# Patient Record
Sex: Female | Born: 1935 | Race: White | Hispanic: No | Smoking: Never smoker
Health system: Southern US, Community
[De-identification: ages and names within clinical notes are randomized; demographics above are authoritative.]

## PROBLEM LIST (undated history)

## (undated) DIAGNOSIS — F0390 Unspecified dementia without behavioral disturbance: Secondary | ICD-10-CM

## (undated) DIAGNOSIS — C3490 Malignant neoplasm of unspecified part of unspecified bronchus or lung: Secondary | ICD-10-CM

## (undated) DIAGNOSIS — Z853 Personal history of malignant neoplasm of breast: Secondary | ICD-10-CM

## (undated) DIAGNOSIS — S72009A Fracture of unspecified part of neck of unspecified femur, initial encounter for closed fracture: Secondary | ICD-10-CM

## (undated) DIAGNOSIS — G629 Polyneuropathy, unspecified: Secondary | ICD-10-CM

## (undated) DIAGNOSIS — J449 Chronic obstructive pulmonary disease, unspecified: Secondary | ICD-10-CM

## (undated) DIAGNOSIS — K219 Gastro-esophageal reflux disease without esophagitis: Secondary | ICD-10-CM

## (undated) DIAGNOSIS — F411 Generalized anxiety disorder: Secondary | ICD-10-CM

## (undated) DIAGNOSIS — I10 Essential (primary) hypertension: Secondary | ICD-10-CM

## (undated) DIAGNOSIS — C801 Malignant (primary) neoplasm, unspecified: Secondary | ICD-10-CM

## (undated) HISTORY — DX: Generalized anxiety disorder: F41.1

## (undated) HISTORY — PX: LUNG CANCER SURGERY: SHX702

## (undated) HISTORY — DX: Malignant neoplasm of unspecified part of unspecified bronchus or lung: C34.90

## (undated) HISTORY — DX: Gastro-esophageal reflux disease without esophagitis: K21.9

## (undated) HISTORY — DX: Essential (primary) hypertension: I10

## (undated) HISTORY — DX: Malignant (primary) neoplasm, unspecified: C80.1

## (undated) HISTORY — DX: Fracture of unspecified part of neck of unspecified femur, initial encounter for closed fracture: S72.009A

## (undated) HISTORY — PX: BREAST LUMPECTOMY: SHX2

## (undated) HISTORY — DX: Personal history of malignant neoplasm of breast: Z85.3

## (undated) HISTORY — PX: OTHER SURGICAL HISTORY: SHX169

---

## 1998-08-09 ENCOUNTER — Other Ambulatory Visit: Admission: RE | Admit: 1998-08-09 | Discharge: 1998-08-09 | Payer: Self-pay | Admitting: Internal Medicine

## 1999-10-08 ENCOUNTER — Emergency Department (HOSPITAL_COMMUNITY): Admission: EM | Admit: 1999-10-08 | Discharge: 1999-10-08 | Payer: Self-pay | Admitting: Emergency Medicine

## 1999-11-23 ENCOUNTER — Encounter: Payer: Self-pay | Admitting: Surgery

## 1999-11-23 ENCOUNTER — Encounter: Admission: RE | Admit: 1999-11-23 | Discharge: 1999-11-23 | Payer: Self-pay | Admitting: Surgery

## 2000-08-06 ENCOUNTER — Other Ambulatory Visit: Admission: RE | Admit: 2000-08-06 | Discharge: 2000-08-06 | Payer: Self-pay | Admitting: Internal Medicine

## 2003-02-10 ENCOUNTER — Emergency Department (HOSPITAL_COMMUNITY): Admission: EM | Admit: 2003-02-10 | Discharge: 2003-02-10 | Payer: Self-pay | Admitting: Emergency Medicine

## 2003-08-01 ENCOUNTER — Emergency Department (HOSPITAL_COMMUNITY): Admission: EM | Admit: 2003-08-01 | Discharge: 2003-08-01 | Payer: Self-pay | Admitting: Emergency Medicine

## 2003-08-01 ENCOUNTER — Emergency Department (HOSPITAL_COMMUNITY): Admission: EM | Admit: 2003-08-01 | Discharge: 2003-08-02 | Payer: Self-pay | Admitting: Emergency Medicine

## 2003-08-01 ENCOUNTER — Encounter: Payer: Self-pay | Admitting: Emergency Medicine

## 2003-08-03 ENCOUNTER — Emergency Department (HOSPITAL_COMMUNITY): Admission: EM | Admit: 2003-08-03 | Discharge: 2003-08-04 | Payer: Self-pay | Admitting: Emergency Medicine

## 2004-03-11 ENCOUNTER — Emergency Department (HOSPITAL_COMMUNITY): Admission: EM | Admit: 2004-03-11 | Discharge: 2004-03-11 | Payer: Self-pay | Admitting: Emergency Medicine

## 2004-03-12 ENCOUNTER — Emergency Department (HOSPITAL_COMMUNITY): Admission: EM | Admit: 2004-03-12 | Discharge: 2004-03-12 | Payer: Self-pay | Admitting: Emergency Medicine

## 2004-03-13 ENCOUNTER — Emergency Department (HOSPITAL_COMMUNITY): Admission: EM | Admit: 2004-03-13 | Discharge: 2004-03-13 | Payer: Self-pay | Admitting: Emergency Medicine

## 2004-03-14 ENCOUNTER — Emergency Department (HOSPITAL_COMMUNITY): Admission: EM | Admit: 2004-03-14 | Discharge: 2004-03-14 | Payer: Self-pay | Admitting: *Deleted

## 2004-03-15 ENCOUNTER — Emergency Department (HOSPITAL_COMMUNITY): Admission: EM | Admit: 2004-03-15 | Discharge: 2004-03-15 | Payer: Self-pay | Admitting: Family Medicine

## 2004-03-17 ENCOUNTER — Emergency Department (HOSPITAL_COMMUNITY): Admission: EM | Admit: 2004-03-17 | Discharge: 2004-03-17 | Payer: Self-pay | Admitting: Family Medicine

## 2004-03-17 ENCOUNTER — Emergency Department (HOSPITAL_COMMUNITY): Admission: EM | Admit: 2004-03-17 | Discharge: 2004-03-18 | Payer: Self-pay | Admitting: Emergency Medicine

## 2004-03-20 ENCOUNTER — Emergency Department (HOSPITAL_COMMUNITY): Admission: EM | Admit: 2004-03-20 | Discharge: 2004-03-20 | Payer: Self-pay | Admitting: Emergency Medicine

## 2004-03-27 ENCOUNTER — Encounter (INDEPENDENT_AMBULATORY_CARE_PROVIDER_SITE_OTHER): Payer: Self-pay | Admitting: Specialist

## 2004-03-27 ENCOUNTER — Observation Stay (HOSPITAL_COMMUNITY): Admission: RE | Admit: 2004-03-27 | Discharge: 2004-03-28 | Payer: Self-pay | Admitting: Obstetrics and Gynecology

## 2004-04-03 ENCOUNTER — Emergency Department (HOSPITAL_COMMUNITY): Admission: EM | Admit: 2004-04-03 | Discharge: 2004-04-04 | Payer: Self-pay | Admitting: Emergency Medicine

## 2004-04-04 ENCOUNTER — Ambulatory Visit: Admission: RE | Admit: 2004-04-04 | Discharge: 2004-04-04 | Payer: Self-pay | Admitting: Gynecology

## 2004-04-11 ENCOUNTER — Inpatient Hospital Stay (HOSPITAL_COMMUNITY): Admission: RE | Admit: 2004-04-11 | Discharge: 2004-04-14 | Payer: Self-pay | Admitting: Obstetrics and Gynecology

## 2004-04-11 ENCOUNTER — Encounter (INDEPENDENT_AMBULATORY_CARE_PROVIDER_SITE_OTHER): Payer: Self-pay | Admitting: Specialist

## 2004-04-19 ENCOUNTER — Emergency Department (HOSPITAL_COMMUNITY): Admission: EM | Admit: 2004-04-19 | Discharge: 2004-04-19 | Payer: Self-pay | Admitting: Emergency Medicine

## 2004-04-26 ENCOUNTER — Ambulatory Visit: Admission: RE | Admit: 2004-04-26 | Discharge: 2004-04-26 | Payer: Self-pay | Admitting: Gynecology

## 2004-05-01 ENCOUNTER — Ambulatory Visit (HOSPITAL_COMMUNITY): Admission: RE | Admit: 2004-05-01 | Discharge: 2004-05-01 | Payer: Self-pay | Admitting: Gynecology

## 2004-05-10 ENCOUNTER — Ambulatory Visit: Admission: RE | Admit: 2004-05-10 | Discharge: 2004-05-10 | Payer: Self-pay | Admitting: Gynecology

## 2004-05-15 ENCOUNTER — Ambulatory Visit: Admission: RE | Admit: 2004-05-15 | Discharge: 2004-07-27 | Payer: Self-pay | Admitting: Radiation Oncology

## 2004-05-19 ENCOUNTER — Ambulatory Visit (HOSPITAL_COMMUNITY): Admission: RE | Admit: 2004-05-19 | Discharge: 2004-05-19 | Payer: Self-pay | Admitting: Radiation Oncology

## 2004-05-30 ENCOUNTER — Inpatient Hospital Stay (HOSPITAL_COMMUNITY): Admission: AD | Admit: 2004-05-30 | Discharge: 2004-05-30 | Payer: Self-pay | Admitting: Obstetrics and Gynecology

## 2004-05-30 ENCOUNTER — Ambulatory Visit: Admission: RE | Admit: 2004-05-30 | Discharge: 2004-05-30 | Payer: Self-pay | Admitting: Gynecology

## 2004-06-17 ENCOUNTER — Emergency Department (HOSPITAL_COMMUNITY): Admission: EM | Admit: 2004-06-17 | Discharge: 2004-06-17 | Payer: Self-pay | Admitting: Family Medicine

## 2004-06-19 ENCOUNTER — Emergency Department (HOSPITAL_COMMUNITY): Admission: EM | Admit: 2004-06-19 | Discharge: 2004-06-19 | Payer: Self-pay | Admitting: Emergency Medicine

## 2004-08-08 ENCOUNTER — Ambulatory Visit: Admission: RE | Admit: 2004-08-08 | Discharge: 2004-08-08 | Payer: Self-pay | Admitting: Radiation Oncology

## 2004-10-03 ENCOUNTER — Ambulatory Visit: Admission: RE | Admit: 2004-10-03 | Discharge: 2004-10-03 | Payer: Self-pay | Admitting: Gynecology

## 2004-10-03 ENCOUNTER — Other Ambulatory Visit: Admission: RE | Admit: 2004-10-03 | Discharge: 2004-10-03 | Payer: Self-pay | Admitting: Gynecology

## 2005-01-19 ENCOUNTER — Encounter (INDEPENDENT_AMBULATORY_CARE_PROVIDER_SITE_OTHER): Payer: Self-pay | Admitting: *Deleted

## 2005-01-19 ENCOUNTER — Ambulatory Visit: Admission: RE | Admit: 2005-01-19 | Discharge: 2005-01-19 | Payer: Self-pay | Admitting: Gynecology

## 2005-05-11 ENCOUNTER — Encounter (INDEPENDENT_AMBULATORY_CARE_PROVIDER_SITE_OTHER): Payer: Self-pay | Admitting: *Deleted

## 2005-05-11 ENCOUNTER — Ambulatory Visit: Admission: RE | Admit: 2005-05-11 | Discharge: 2005-05-11 | Payer: Self-pay | Admitting: Gynecology

## 2005-05-11 ENCOUNTER — Other Ambulatory Visit: Admission: RE | Admit: 2005-05-11 | Discharge: 2005-05-11 | Payer: Self-pay | Admitting: Gynecology

## 2005-08-24 ENCOUNTER — Ambulatory Visit: Admission: RE | Admit: 2005-08-24 | Discharge: 2005-08-24 | Payer: Self-pay | Admitting: Gynecology

## 2005-08-24 ENCOUNTER — Other Ambulatory Visit: Admission: RE | Admit: 2005-08-24 | Discharge: 2005-08-24 | Payer: Self-pay | Admitting: Gynecology

## 2006-01-11 ENCOUNTER — Ambulatory Visit: Admission: RE | Admit: 2006-01-11 | Discharge: 2006-01-11 | Payer: Self-pay | Admitting: Gynecology

## 2006-01-11 ENCOUNTER — Encounter (INDEPENDENT_AMBULATORY_CARE_PROVIDER_SITE_OTHER): Payer: Self-pay | Admitting: *Deleted

## 2006-01-11 ENCOUNTER — Other Ambulatory Visit: Admission: RE | Admit: 2006-01-11 | Discharge: 2006-01-11 | Payer: Self-pay | Admitting: Gynecology

## 2006-04-30 ENCOUNTER — Other Ambulatory Visit: Admission: RE | Admit: 2006-04-30 | Discharge: 2006-04-30 | Payer: Self-pay | Admitting: Gynecology

## 2006-04-30 ENCOUNTER — Encounter (INDEPENDENT_AMBULATORY_CARE_PROVIDER_SITE_OTHER): Payer: Self-pay | Admitting: *Deleted

## 2006-04-30 ENCOUNTER — Ambulatory Visit: Admission: RE | Admit: 2006-04-30 | Discharge: 2006-04-30 | Payer: Self-pay | Admitting: Gynecology

## 2006-06-26 ENCOUNTER — Ambulatory Visit (HOSPITAL_COMMUNITY): Admission: RE | Admit: 2006-06-26 | Discharge: 2006-06-26 | Payer: Self-pay | Admitting: Gynecology

## 2006-10-30 ENCOUNTER — Other Ambulatory Visit: Admission: RE | Admit: 2006-10-30 | Discharge: 2006-10-30 | Payer: Self-pay | Admitting: Gynecology

## 2006-10-30 ENCOUNTER — Ambulatory Visit: Admission: RE | Admit: 2006-10-30 | Discharge: 2006-10-30 | Payer: Self-pay | Admitting: Gynecology

## 2006-10-30 ENCOUNTER — Encounter (INDEPENDENT_AMBULATORY_CARE_PROVIDER_SITE_OTHER): Payer: Self-pay | Admitting: *Deleted

## 2006-12-01 ENCOUNTER — Emergency Department (HOSPITAL_COMMUNITY): Admission: EM | Admit: 2006-12-01 | Discharge: 2006-12-01 | Payer: Self-pay | Admitting: Emergency Medicine

## 2007-01-19 ENCOUNTER — Emergency Department (HOSPITAL_COMMUNITY): Admission: EM | Admit: 2007-01-19 | Discharge: 2007-01-19 | Payer: Self-pay | Admitting: Emergency Medicine

## 2007-01-27 ENCOUNTER — Emergency Department (HOSPITAL_COMMUNITY): Admission: EM | Admit: 2007-01-27 | Discharge: 2007-01-27 | Payer: Self-pay | Admitting: Emergency Medicine

## 2007-04-22 ENCOUNTER — Ambulatory Visit: Admission: RE | Admit: 2007-04-22 | Discharge: 2007-04-22 | Payer: Self-pay | Admitting: Gynecology

## 2007-04-22 ENCOUNTER — Encounter: Payer: Self-pay | Admitting: Gynecology

## 2007-04-22 ENCOUNTER — Other Ambulatory Visit: Admission: RE | Admit: 2007-04-22 | Discharge: 2007-04-22 | Payer: Self-pay | Admitting: Gynecology

## 2007-08-02 ENCOUNTER — Emergency Department (HOSPITAL_COMMUNITY): Admission: EM | Admit: 2007-08-02 | Discharge: 2007-08-02 | Payer: Self-pay | Admitting: Emergency Medicine

## 2007-08-07 ENCOUNTER — Ambulatory Visit: Payer: Self-pay | Admitting: Thoracic Surgery

## 2007-08-13 ENCOUNTER — Ambulatory Visit (HOSPITAL_COMMUNITY): Admission: RE | Admit: 2007-08-13 | Discharge: 2007-08-13 | Payer: Self-pay | Admitting: Thoracic Surgery

## 2007-08-15 ENCOUNTER — Ambulatory Visit (HOSPITAL_COMMUNITY): Admission: RE | Admit: 2007-08-15 | Discharge: 2007-08-15 | Payer: Self-pay | Admitting: Thoracic Surgery

## 2007-08-15 ENCOUNTER — Ambulatory Visit: Payer: Self-pay | Admitting: Thoracic Surgery

## 2007-08-15 ENCOUNTER — Ambulatory Visit: Payer: Self-pay | Admitting: Internal Medicine

## 2007-08-15 ENCOUNTER — Encounter: Payer: Self-pay | Admitting: Thoracic Surgery

## 2007-08-17 ENCOUNTER — Emergency Department (HOSPITAL_COMMUNITY): Admission: EM | Admit: 2007-08-17 | Discharge: 2007-08-17 | Payer: Self-pay | Admitting: Emergency Medicine

## 2007-08-20 ENCOUNTER — Ambulatory Visit: Admission: RE | Admit: 2007-08-20 | Discharge: 2007-10-06 | Payer: Self-pay | Admitting: Radiation Oncology

## 2007-08-20 LAB — COMPREHENSIVE METABOLIC PANEL
ALT: 8 U/L (ref 0–35)
AST: 10 U/L (ref 0–37)
Albumin: 3.5 g/dL (ref 3.5–5.2)
BUN: 13 mg/dL (ref 6–23)
CO2: 24 mEq/L (ref 19–32)
Calcium: 9.1 mg/dL (ref 8.4–10.5)
Chloride: 106 mEq/L (ref 96–112)
Creatinine, Ser: 0.65 mg/dL (ref 0.40–1.20)
Potassium: 5 mEq/L (ref 3.5–5.3)

## 2007-08-20 LAB — CBC WITH DIFFERENTIAL/PLATELET
Basophils Absolute: 0 10*3/uL (ref 0.0–0.1)
HCT: 36.4 % (ref 34.8–46.6)
HGB: 12 g/dL (ref 11.6–15.9)
MONO#: 0.7 10*3/uL (ref 0.1–0.9)
NEUT#: 4.7 10*3/uL (ref 1.5–6.5)
NEUT%: 68.8 % (ref 39.6–76.8)
RDW: 18 % — ABNORMAL HIGH (ref 11.3–14.5)
WBC: 6.8 10*3/uL (ref 3.9–10.0)
lymph#: 1.2 10*3/uL (ref 0.9–3.3)

## 2007-08-28 ENCOUNTER — Ambulatory Visit: Payer: Self-pay | Admitting: Thoracic Surgery

## 2007-09-01 LAB — CBC WITH DIFFERENTIAL/PLATELET
BASO%: 0.1 % (ref 0.0–2.0)
EOS%: 0.4 % (ref 0.0–7.0)
HCT: 35.6 % (ref 34.8–46.6)
HGB: 11.9 g/dL (ref 11.6–15.9)
MCH: 25.6 pg — ABNORMAL LOW (ref 26.0–34.0)
MCHC: 33.3 g/dL (ref 32.0–36.0)
MONO#: 0.7 10*3/uL (ref 0.1–0.9)
NEUT%: 77.6 % — ABNORMAL HIGH (ref 39.6–76.8)
RDW: 17.6 % — ABNORMAL HIGH (ref 11.3–14.5)
WBC: 6.7 10*3/uL (ref 3.9–10.0)
lymph#: 0.8 10*3/uL — ABNORMAL LOW (ref 0.9–3.3)

## 2007-09-01 LAB — COMPREHENSIVE METABOLIC PANEL
ALT: 8 U/L (ref 0–35)
AST: 11 U/L (ref 0–37)
CO2: 20 mEq/L (ref 19–32)
Chloride: 104 mEq/L (ref 96–112)
Creatinine, Ser: 0.65 mg/dL (ref 0.40–1.20)
Sodium: 139 mEq/L (ref 135–145)
Total Bilirubin: 0.4 mg/dL (ref 0.3–1.2)
Total Protein: 6.3 g/dL (ref 6.0–8.3)

## 2007-09-08 LAB — COMPREHENSIVE METABOLIC PANEL
Albumin: 3.8 g/dL (ref 3.5–5.2)
BUN: 17 mg/dL (ref 6–23)
CO2: 25 mEq/L (ref 19–32)
Calcium: 9.1 mg/dL (ref 8.4–10.5)
Chloride: 104 mEq/L (ref 96–112)
Glucose, Bld: 104 mg/dL — ABNORMAL HIGH (ref 70–99)
Potassium: 4.4 mEq/L (ref 3.5–5.3)
Total Protein: 6.2 g/dL (ref 6.0–8.3)

## 2007-09-08 LAB — CBC WITH DIFFERENTIAL/PLATELET
Basophils Absolute: 0 10*3/uL (ref 0.0–0.1)
Eosinophils Absolute: 0.2 10*3/uL (ref 0.0–0.5)
HCT: 33.8 % — ABNORMAL LOW (ref 34.8–46.6)
MCV: 76.9 fL — ABNORMAL LOW (ref 81.0–101.0)
MONO#: 0.7 10*3/uL (ref 0.1–0.9)
RDW: 18.3 % — ABNORMAL HIGH (ref 11.3–14.5)
lymph#: 0.9 10*3/uL (ref 0.9–3.3)

## 2007-09-15 LAB — COMPREHENSIVE METABOLIC PANEL
AST: 13 U/L (ref 0–37)
Albumin: 3.9 g/dL (ref 3.5–5.2)
Alkaline Phosphatase: 145 U/L — ABNORMAL HIGH (ref 39–117)
BUN: 17 mg/dL (ref 6–23)
Calcium: 9 mg/dL (ref 8.4–10.5)
Chloride: 105 mEq/L (ref 96–112)
Glucose, Bld: 93 mg/dL (ref 70–99)
Potassium: 4.8 mEq/L (ref 3.5–5.3)
Sodium: 140 mEq/L (ref 135–145)
Total Protein: 6.5 g/dL (ref 6.0–8.3)

## 2007-09-15 LAB — CBC WITH DIFFERENTIAL/PLATELET
Basophils Absolute: 0 10*3/uL (ref 0.0–0.1)
EOS%: 0.2 % (ref 0.0–7.0)
Eosinophils Absolute: 0 10*3/uL (ref 0.0–0.5)
HGB: 11.6 g/dL (ref 11.6–15.9)
NEUT#: 7.7 10*3/uL — ABNORMAL HIGH (ref 1.5–6.5)
RBC: 4.43 10*6/uL (ref 3.70–5.32)
RDW: 18.4 % — ABNORMAL HIGH (ref 11.3–14.5)
lymph#: 1.1 10*3/uL (ref 0.9–3.3)

## 2007-09-18 ENCOUNTER — Ambulatory Visit: Payer: Self-pay | Admitting: Thoracic Surgery

## 2007-09-22 LAB — COMPREHENSIVE METABOLIC PANEL
ALT: 13 U/L (ref 0–35)
AST: 16 U/L (ref 0–37)
Albumin: 4 g/dL (ref 3.5–5.2)
CO2: 21 mEq/L (ref 19–32)
Calcium: 9.3 mg/dL (ref 8.4–10.5)
Chloride: 102 mEq/L (ref 96–112)
Potassium: 4.7 mEq/L (ref 3.5–5.3)
Sodium: 138 mEq/L (ref 135–145)
Total Protein: 6.7 g/dL (ref 6.0–8.3)

## 2007-09-22 LAB — CBC WITH DIFFERENTIAL/PLATELET
BASO%: 0.9 % (ref 0.0–2.0)
EOS%: 0.5 % (ref 0.0–7.0)
HCT: 34 % — ABNORMAL LOW (ref 34.8–46.6)
MCH: 26.7 pg (ref 26.0–34.0)
MCHC: 33.7 g/dL (ref 32.0–36.0)
MONO#: 0.7 10*3/uL (ref 0.1–0.9)
RDW: 18.2 % — ABNORMAL HIGH (ref 11.3–14.5)
WBC: 5.9 10*3/uL (ref 3.9–10.0)
lymph#: 0.8 10*3/uL — ABNORMAL LOW (ref 0.9–3.3)

## 2007-09-29 LAB — CBC WITH DIFFERENTIAL/PLATELET
BASO%: 0.3 % (ref 0.0–2.0)
EOS%: 0.5 % (ref 0.0–7.0)
HCT: 32.3 % — ABNORMAL LOW (ref 34.8–46.6)
MCH: 26.9 pg (ref 26.0–34.0)
MCHC: 33.2 g/dL (ref 32.0–36.0)
MONO#: 0.8 10*3/uL (ref 0.1–0.9)
NEUT%: 72.7 % (ref 39.6–76.8)
RBC: 3.98 10*6/uL (ref 3.70–5.32)
RDW: 24.5 % — ABNORMAL HIGH (ref 11.3–14.5)
WBC: 6.5 10*3/uL (ref 3.9–10.0)
lymph#: 0.9 10*3/uL (ref 0.9–3.3)

## 2007-09-29 LAB — COMPREHENSIVE METABOLIC PANEL
ALT: 10 U/L (ref 0–35)
AST: 12 U/L (ref 0–37)
Calcium: 9 mg/dL (ref 8.4–10.5)
Chloride: 104 mEq/L (ref 96–112)
Creatinine, Ser: 0.68 mg/dL (ref 0.40–1.20)
Sodium: 137 mEq/L (ref 135–145)
Total Bilirubin: 0.5 mg/dL (ref 0.3–1.2)
Total Protein: 6.2 g/dL (ref 6.0–8.3)

## 2007-10-02 ENCOUNTER — Ambulatory Visit: Payer: Self-pay | Admitting: Internal Medicine

## 2007-10-06 ENCOUNTER — Other Ambulatory Visit: Payer: Self-pay | Admitting: Internal Medicine

## 2007-10-06 LAB — CBC WITH DIFFERENTIAL/PLATELET
BASO%: 0.2 % (ref 0.0–2.0)
EOS%: 0.1 % (ref 0.0–7.0)
HCT: 32.7 % — ABNORMAL LOW (ref 34.8–46.6)
LYMPH%: 13.2 % — ABNORMAL LOW (ref 14.0–48.0)
MCH: 27.5 pg (ref 26.0–34.0)
MCHC: 33.6 g/dL (ref 32.0–36.0)
NEUT%: 77.8 % — ABNORMAL HIGH (ref 39.6–76.8)
Platelets: 158 10*3/uL (ref 145–400)
RBC: 4 10*6/uL (ref 3.70–5.32)

## 2007-10-06 LAB — COMPREHENSIVE METABOLIC PANEL
ALT: 13 U/L (ref 0–35)
AST: 13 U/L (ref 0–37)
Alkaline Phosphatase: 147 U/L — ABNORMAL HIGH (ref 39–117)
Creatinine, Ser: 0.72 mg/dL (ref 0.40–1.20)
Total Bilirubin: 0.3 mg/dL (ref 0.3–1.2)

## 2007-10-13 LAB — CBC WITH DIFFERENTIAL/PLATELET
Basophils Absolute: 0 10*3/uL (ref 0.0–0.1)
Eosinophils Absolute: 0 10*3/uL (ref 0.0–0.5)
HGB: 10.7 g/dL — ABNORMAL LOW (ref 11.6–15.9)
LYMPH%: 19.1 % (ref 14.0–48.0)
MCV: 83.3 fL (ref 81.0–101.0)
MONO%: 8.7 % (ref 0.0–13.0)
NEUT#: 4 10*3/uL (ref 1.5–6.5)
Platelets: 179 10*3/uL (ref 145–400)

## 2007-10-13 LAB — COMPREHENSIVE METABOLIC PANEL
Albumin: 3.9 g/dL (ref 3.5–5.2)
Alkaline Phosphatase: 112 U/L (ref 39–117)
BUN: 14 mg/dL (ref 6–23)
Glucose, Bld: 75 mg/dL (ref 70–99)
Potassium: 3.7 mEq/L (ref 3.5–5.3)

## 2007-11-03 LAB — CBC WITH DIFFERENTIAL/PLATELET
Basophils Absolute: 0 10*3/uL (ref 0.0–0.1)
Eosinophils Absolute: 0 10*3/uL (ref 0.0–0.5)
HGB: 10.6 g/dL — ABNORMAL LOW (ref 11.6–15.9)
MCV: 89 fL (ref 81.0–101.0)
MONO#: 0.6 10*3/uL (ref 0.1–0.9)
MONO%: 14.5 % — ABNORMAL HIGH (ref 0.0–13.0)
NEUT#: 2.3 10*3/uL (ref 1.5–6.5)
RDW: 30.9 % — ABNORMAL HIGH (ref 11.3–14.5)
WBC: 3.9 10*3/uL (ref 3.9–10.0)
lymph#: 1 10*3/uL (ref 0.9–3.3)

## 2007-11-03 LAB — COMPREHENSIVE METABOLIC PANEL
Albumin: 3.8 g/dL (ref 3.5–5.2)
BUN: 12 mg/dL (ref 6–23)
CO2: 23 mEq/L (ref 19–32)
Calcium: 9.2 mg/dL (ref 8.4–10.5)
Chloride: 107 mEq/L (ref 96–112)
Glucose, Bld: 87 mg/dL (ref 70–99)
Potassium: 4.9 mEq/L (ref 3.5–5.3)
Total Protein: 5.9 g/dL — ABNORMAL LOW (ref 6.0–8.3)

## 2007-11-10 LAB — CBC WITH DIFFERENTIAL/PLATELET
Basophils Absolute: 0 10*3/uL (ref 0.0–0.1)
Eosinophils Absolute: 0.1 10*3/uL (ref 0.0–0.5)
HGB: 11.3 g/dL — ABNORMAL LOW (ref 11.6–15.9)
NEUT#: 2.6 10*3/uL (ref 1.5–6.5)
RBC: 3.67 10*6/uL — ABNORMAL LOW (ref 3.70–5.32)
RDW: 21.1 % — ABNORMAL HIGH (ref 11.3–14.5)
WBC: 4.3 10*3/uL (ref 3.9–10.0)
lymph#: 1 10*3/uL (ref 0.9–3.3)

## 2007-11-10 LAB — COMPREHENSIVE METABOLIC PANEL
Alkaline Phosphatase: 118 U/L — ABNORMAL HIGH (ref 39–117)
BUN: 11 mg/dL (ref 6–23)
Creatinine, Ser: 0.65 mg/dL (ref 0.40–1.20)
Glucose, Bld: 89 mg/dL (ref 70–99)
Total Bilirubin: 0.5 mg/dL (ref 0.3–1.2)

## 2007-11-13 ENCOUNTER — Ambulatory Visit: Payer: Self-pay | Admitting: Internal Medicine

## 2007-11-17 ENCOUNTER — Inpatient Hospital Stay (HOSPITAL_COMMUNITY): Admission: AD | Admit: 2007-11-17 | Discharge: 2007-11-20 | Payer: Self-pay | Admitting: Internal Medicine

## 2007-11-17 ENCOUNTER — Ambulatory Visit: Payer: Self-pay | Admitting: Internal Medicine

## 2007-11-17 LAB — CBC WITH DIFFERENTIAL/PLATELET
BASO%: 0 % (ref 0.0–2.0)
MCHC: 33.5 g/dL (ref 32.0–36.0)
MONO#: 0.2 10*3/uL (ref 0.1–0.9)
RBC: 3.41 10*6/uL — ABNORMAL LOW (ref 3.70–5.32)
WBC: 0.7 10*3/uL — CL (ref 3.9–10.0)
lymph#: 0.4 10*3/uL — ABNORMAL LOW (ref 0.9–3.3)

## 2007-11-17 LAB — COMPREHENSIVE METABOLIC PANEL
ALT: 26 U/L (ref 0–35)
CO2: 24 mEq/L (ref 19–32)
Calcium: 8.8 mg/dL (ref 8.4–10.5)
Chloride: 103 mEq/L (ref 96–112)
Potassium: 4.5 mEq/L (ref 3.5–5.3)
Sodium: 138 mEq/L (ref 135–145)
Total Protein: 6 g/dL (ref 6.0–8.3)

## 2007-11-25 ENCOUNTER — Encounter: Payer: Self-pay | Admitting: Gynecology

## 2007-11-25 ENCOUNTER — Other Ambulatory Visit: Admission: RE | Admit: 2007-11-25 | Discharge: 2007-11-25 | Payer: Self-pay | Admitting: Gynecology

## 2007-11-25 ENCOUNTER — Ambulatory Visit: Admission: RE | Admit: 2007-11-25 | Discharge: 2007-11-25 | Payer: Self-pay | Admitting: Gynecology

## 2007-11-25 LAB — COMPREHENSIVE METABOLIC PANEL
ALT: 24 U/L (ref 0–35)
AST: 16 U/L (ref 0–37)
Alkaline Phosphatase: 132 U/L — ABNORMAL HIGH (ref 39–117)
CO2: 23 mEq/L (ref 19–32)
Sodium: 140 mEq/L (ref 135–145)
Total Bilirubin: 0.4 mg/dL (ref 0.3–1.2)
Total Protein: 6.2 g/dL (ref 6.0–8.3)

## 2007-11-25 LAB — CBC WITH DIFFERENTIAL/PLATELET
BASO%: 0.1 % (ref 0.0–2.0)
LYMPH%: 11.9 % — ABNORMAL LOW (ref 14.0–48.0)
MCHC: 34.8 g/dL (ref 32.0–36.0)
MONO#: 0.5 10*3/uL (ref 0.1–0.9)
Platelets: 90 10*3/uL — ABNORMAL LOW (ref 145–400)
RBC: 4.03 10*6/uL (ref 3.70–5.32)
WBC: 6.7 10*3/uL (ref 3.9–10.0)
lymph#: 0.8 10*3/uL — ABNORMAL LOW (ref 0.9–3.3)

## 2007-12-01 LAB — CBC WITH DIFFERENTIAL/PLATELET
BASO%: 1.4 % (ref 0.0–2.0)
Basophils Absolute: 0.1 10*3/uL (ref 0.0–0.1)
HCT: 36.8 % (ref 34.8–46.6)
HGB: 12.7 g/dL (ref 11.6–15.9)
MCHC: 34.5 g/dL (ref 32.0–36.0)
MONO#: 0.5 10*3/uL (ref 0.1–0.9)
NEUT%: 56.8 % (ref 39.6–76.8)
RDW: 17.8 % — ABNORMAL HIGH (ref 11.3–14.5)
WBC: 3.6 10*3/uL — ABNORMAL LOW (ref 3.9–10.0)
lymph#: 0.9 10*3/uL (ref 0.9–3.3)

## 2007-12-01 LAB — COMPREHENSIVE METABOLIC PANEL
ALT: 18 U/L (ref 0–35)
Albumin: 4.1 g/dL (ref 3.5–5.2)
CO2: 22 mEq/L (ref 19–32)
Calcium: 9.4 mg/dL (ref 8.4–10.5)
Chloride: 107 mEq/L (ref 96–112)
Creatinine, Ser: 0.64 mg/dL (ref 0.40–1.20)
Potassium: 5.3 mEq/L (ref 3.5–5.3)
Total Protein: 6.4 g/dL (ref 6.0–8.3)

## 2007-12-08 LAB — CBC WITH DIFFERENTIAL/PLATELET
BASO%: 0.3 % (ref 0.0–2.0)
EOS%: 0.9 % (ref 0.0–7.0)
HCT: 33.7 % — ABNORMAL LOW (ref 34.8–46.6)
MCH: 32.9 pg (ref 26.0–34.0)
MCHC: 34.8 g/dL (ref 32.0–36.0)
MONO#: 0.7 10*3/uL (ref 0.1–0.9)
RBC: 3.56 10*6/uL — ABNORMAL LOW (ref 3.70–5.32)
RDW: 20 % — ABNORMAL HIGH (ref 11.3–14.5)
WBC: 8.4 10*3/uL (ref 3.9–10.0)
lymph#: 1.1 10*3/uL (ref 0.9–3.3)

## 2007-12-16 LAB — CBC WITH DIFFERENTIAL/PLATELET
Eosinophils Absolute: 0 10*3/uL (ref 0.0–0.5)
MONO#: 0.6 10*3/uL (ref 0.1–0.9)
NEUT#: 4.2 10*3/uL (ref 1.5–6.5)
Platelets: 127 10*3/uL — ABNORMAL LOW (ref 145–400)
RBC: 3.37 10*6/uL — ABNORMAL LOW (ref 3.70–5.32)
RDW: 21 % — ABNORMAL HIGH (ref 11.3–14.5)
WBC: 5.5 10*3/uL (ref 3.9–10.0)
lymph#: 0.7 10*3/uL — ABNORMAL LOW (ref 0.9–3.3)

## 2007-12-16 LAB — COMPREHENSIVE METABOLIC PANEL
ALT: 13 U/L (ref 0–35)
Albumin: 3.9 g/dL (ref 3.5–5.2)
CO2: 24 mEq/L (ref 19–32)
Chloride: 105 mEq/L (ref 96–112)
Glucose, Bld: 105 mg/dL — ABNORMAL HIGH (ref 70–99)
Potassium: 4.6 mEq/L (ref 3.5–5.3)
Sodium: 139 mEq/L (ref 135–145)
Total Protein: 5.8 g/dL — ABNORMAL LOW (ref 6.0–8.3)

## 2007-12-23 LAB — COMPREHENSIVE METABOLIC PANEL
AST: 20 U/L (ref 0–37)
Albumin: 4 g/dL (ref 3.5–5.2)
Alkaline Phosphatase: 112 U/L (ref 39–117)
Potassium: 4.4 mEq/L (ref 3.5–5.3)
Sodium: 140 mEq/L (ref 135–145)
Total Bilirubin: 0.5 mg/dL (ref 0.3–1.2)
Total Protein: 6.3 g/dL (ref 6.0–8.3)

## 2007-12-23 LAB — CBC WITH DIFFERENTIAL/PLATELET
Basophils Absolute: 0.2 10*3/uL — ABNORMAL HIGH (ref 0.0–0.1)
EOS%: 0.9 % (ref 0.0–7.0)
Eosinophils Absolute: 0 10*3/uL (ref 0.0–0.5)
HGB: 11.7 g/dL (ref 11.6–15.9)
LYMPH%: 14.8 % (ref 14.0–48.0)
MCH: 34.3 pg — ABNORMAL HIGH (ref 26.0–34.0)
MCV: 101 fL (ref 81.0–101.0)
MONO%: 16.3 % — ABNORMAL HIGH (ref 0.0–13.0)
Platelets: 139 10*3/uL — ABNORMAL LOW (ref 145–400)
RBC: 3.41 10*6/uL — ABNORMAL LOW (ref 3.70–5.32)
RDW: 18.6 % — ABNORMAL HIGH (ref 11.3–14.5)

## 2007-12-29 ENCOUNTER — Ambulatory Visit: Payer: Self-pay | Admitting: Internal Medicine

## 2007-12-29 LAB — COMPREHENSIVE METABOLIC PANEL
ALT: 14 U/L (ref 0–35)
AST: 15 U/L (ref 0–37)
Albumin: 4.1 g/dL (ref 3.5–5.2)
Alkaline Phosphatase: 120 U/L — ABNORMAL HIGH (ref 39–117)
BUN: 17 mg/dL (ref 6–23)
CO2: 23 mEq/L (ref 19–32)
Calcium: 8.9 mg/dL (ref 8.4–10.5)
Chloride: 106 mEq/L (ref 96–112)
Creatinine, Ser: 0.62 mg/dL (ref 0.40–1.20)
Glucose, Bld: 107 mg/dL — ABNORMAL HIGH (ref 70–99)
Potassium: 4.3 mEq/L (ref 3.5–5.3)
Sodium: 141 mEq/L (ref 135–145)
Total Bilirubin: 1 mg/dL (ref 0.3–1.2)
Total Protein: 6 g/dL (ref 6.0–8.3)

## 2007-12-29 LAB — CBC WITH DIFFERENTIAL/PLATELET
BASO%: 0.1 % (ref 0.0–2.0)
EOS%: 0.6 % (ref 0.0–7.0)
Eosinophils Absolute: 0 10*3/uL (ref 0.0–0.5)
LYMPH%: 10.7 % — ABNORMAL LOW (ref 14.0–48.0)
MCHC: 34.6 g/dL (ref 32.0–36.0)
MCV: 101.3 fL — ABNORMAL HIGH (ref 81.0–101.0)
MONO%: 7.9 % (ref 0.0–13.0)
NEUT#: 4.6 10*3/uL (ref 1.5–6.5)
Platelets: 65 10*3/uL — ABNORMAL LOW (ref 145–400)
RBC: 3.03 10*6/uL — ABNORMAL LOW (ref 3.70–5.32)
RDW: 20.2 % — ABNORMAL HIGH (ref 11.3–14.5)

## 2008-01-07 ENCOUNTER — Ambulatory Visit (HOSPITAL_COMMUNITY): Admission: RE | Admit: 2008-01-07 | Discharge: 2008-01-07 | Payer: Self-pay | Admitting: Internal Medicine

## 2008-03-16 ENCOUNTER — Ambulatory Visit (HOSPITAL_COMMUNITY): Admission: RE | Admit: 2008-03-16 | Discharge: 2008-03-16 | Payer: Self-pay | Admitting: Internal Medicine

## 2008-04-02 ENCOUNTER — Ambulatory Visit: Payer: Self-pay | Admitting: Internal Medicine

## 2008-04-06 LAB — COMPREHENSIVE METABOLIC PANEL
CO2: 25 mEq/L (ref 19–32)
Calcium: 8.6 mg/dL (ref 8.4–10.5)
Glucose, Bld: 99 mg/dL (ref 70–99)
Sodium: 145 mEq/L (ref 135–145)
Total Bilirubin: 0.5 mg/dL (ref 0.3–1.2)
Total Protein: 5.7 g/dL — ABNORMAL LOW (ref 6.0–8.3)

## 2008-04-06 LAB — CBC WITH DIFFERENTIAL/PLATELET
BASO%: 0.3 % (ref 0.0–2.0)
LYMPH%: 16.1 % (ref 14.0–48.0)
MCH: 34.5 pg — ABNORMAL HIGH (ref 26.0–34.0)
MCHC: 34.3 g/dL (ref 32.0–36.0)
MCV: 100.6 fL (ref 81.0–101.0)
MONO%: 7.2 % (ref 0.0–13.0)
Platelets: 216 10*3/uL (ref 145–400)
RBC: 3.77 10*6/uL (ref 3.70–5.32)

## 2008-04-08 ENCOUNTER — Ambulatory Visit (HOSPITAL_COMMUNITY): Admission: RE | Admit: 2008-04-08 | Discharge: 2008-04-08 | Payer: Self-pay | Admitting: Internal Medicine

## 2008-06-08 ENCOUNTER — Other Ambulatory Visit: Admission: RE | Admit: 2008-06-08 | Discharge: 2008-06-08 | Payer: Self-pay | Admitting: Neurology

## 2008-06-08 ENCOUNTER — Ambulatory Visit: Admission: RE | Admit: 2008-06-08 | Discharge: 2008-06-08 | Payer: Self-pay | Admitting: Gynecology

## 2008-06-08 ENCOUNTER — Encounter: Payer: Self-pay | Admitting: Gynecology

## 2008-07-05 ENCOUNTER — Ambulatory Visit: Payer: Self-pay | Admitting: Internal Medicine

## 2008-07-07 ENCOUNTER — Ambulatory Visit (HOSPITAL_COMMUNITY): Admission: RE | Admit: 2008-07-07 | Discharge: 2008-07-07 | Payer: Self-pay | Admitting: Internal Medicine

## 2008-07-07 LAB — COMPREHENSIVE METABOLIC PANEL
ALT: 17 U/L (ref 0–35)
AST: 19 U/L (ref 0–37)
Alkaline Phosphatase: 77 U/L (ref 39–117)
CO2: 30 mEq/L (ref 19–32)
Sodium: 141 mEq/L (ref 135–145)
Total Bilirubin: 0.7 mg/dL (ref 0.3–1.2)
Total Protein: 5.8 g/dL — ABNORMAL LOW (ref 6.0–8.3)

## 2008-07-07 LAB — CBC WITH DIFFERENTIAL/PLATELET
BASO%: 0.3 % (ref 0.0–2.0)
LYMPH%: 24.1 % (ref 14.0–48.0)
MCHC: 34.1 g/dL (ref 32.0–36.0)
MONO#: 0.5 10*3/uL (ref 0.1–0.9)
MONO%: 12.1 % (ref 0.0–13.0)
Platelets: 204 10*3/uL (ref 145–400)
RBC: 4.02 10*6/uL (ref 3.70–5.32)
WBC: 3.8 10*3/uL — ABNORMAL LOW (ref 3.9–10.0)

## 2008-07-13 ENCOUNTER — Encounter: Payer: Self-pay | Admitting: Gastroenterology

## 2008-08-05 DIAGNOSIS — Z853 Personal history of malignant neoplasm of breast: Secondary | ICD-10-CM

## 2008-08-05 DIAGNOSIS — F411 Generalized anxiety disorder: Secondary | ICD-10-CM | POA: Insufficient documentation

## 2008-08-05 DIAGNOSIS — C549 Malignant neoplasm of corpus uteri, unspecified: Secondary | ICD-10-CM

## 2008-08-05 DIAGNOSIS — D649 Anemia, unspecified: Secondary | ICD-10-CM

## 2008-08-05 DIAGNOSIS — C3490 Malignant neoplasm of unspecified part of unspecified bronchus or lung: Secondary | ICD-10-CM | POA: Insufficient documentation

## 2008-08-05 DIAGNOSIS — I10 Essential (primary) hypertension: Secondary | ICD-10-CM | POA: Insufficient documentation

## 2008-08-05 HISTORY — DX: Generalized anxiety disorder: F41.1

## 2008-08-05 HISTORY — DX: Essential (primary) hypertension: I10

## 2008-08-05 HISTORY — DX: Personal history of malignant neoplasm of breast: Z85.3

## 2008-08-05 HISTORY — DX: Malignant neoplasm of unspecified part of unspecified bronchus or lung: C34.90

## 2008-08-09 ENCOUNTER — Ambulatory Visit: Payer: Self-pay | Admitting: Gastroenterology

## 2008-08-09 DIAGNOSIS — R933 Abnormal findings on diagnostic imaging of other parts of digestive tract: Secondary | ICD-10-CM

## 2008-08-12 ENCOUNTER — Ambulatory Visit: Payer: Self-pay | Admitting: Gastroenterology

## 2008-10-04 ENCOUNTER — Ambulatory Visit: Payer: Self-pay | Admitting: Internal Medicine

## 2008-10-06 ENCOUNTER — Ambulatory Visit (HOSPITAL_COMMUNITY): Admission: RE | Admit: 2008-10-06 | Discharge: 2008-10-06 | Payer: Self-pay | Admitting: Internal Medicine

## 2008-10-06 LAB — CBC WITH DIFFERENTIAL/PLATELET
BASO%: 0.4 % (ref 0.0–2.0)
LYMPH%: 22.6 % (ref 14.0–48.0)
MCHC: 33.6 g/dL (ref 32.0–36.0)
MONO#: 0.4 10*3/uL (ref 0.1–0.9)
RBC: 4.38 10*6/uL (ref 3.70–5.32)
RDW: 13.7 % (ref 11.3–14.5)
WBC: 4.5 10*3/uL (ref 3.9–10.0)
lymph#: 1 10*3/uL (ref 0.9–3.3)

## 2008-10-06 LAB — COMPREHENSIVE METABOLIC PANEL
ALT: 16 U/L (ref 0–35)
CO2: 30 mEq/L (ref 19–32)
Chloride: 106 mEq/L (ref 96–112)
Potassium: 4.7 mEq/L (ref 3.5–5.3)
Sodium: 141 mEq/L (ref 135–145)
Total Bilirubin: 0.9 mg/dL (ref 0.3–1.2)
Total Protein: 6.3 g/dL (ref 6.0–8.3)

## 2008-10-12 ENCOUNTER — Encounter: Payer: Self-pay | Admitting: Gastroenterology

## 2009-01-31 ENCOUNTER — Ambulatory Visit: Payer: Self-pay | Admitting: Internal Medicine

## 2009-02-01 IMAGING — CT CT CHEST W/ CM
2 of 7 series · 16 of 46 positions shown, 18 images · IV contrast (agent unspecified)
Comparison: 01/07/2008

CT CHEST

CLINICAL DATA: Follow-up lung carcinoma.  Breast cancer and
endometrial cancer..  Hemoptysis.  Nausea and vomiting.

CT CHEST, ABDOMEN AND PELVIS WITH CONTRAST
TECHNIQUE: Multidetector CT imaging of the chest, abdomen and
pelvis was performed following the standard protocol during bolus
administration of intravenous contrast.
Contrast: 100 ml 2mnipaque-400 and oral contrast

[Series 2: cap 5.0 b40f · axial · 0.65mm/px · z∈[-572,-22]mm · 13 of 126 slices shown, 15 images]
[im 8/126  soft-tissue]
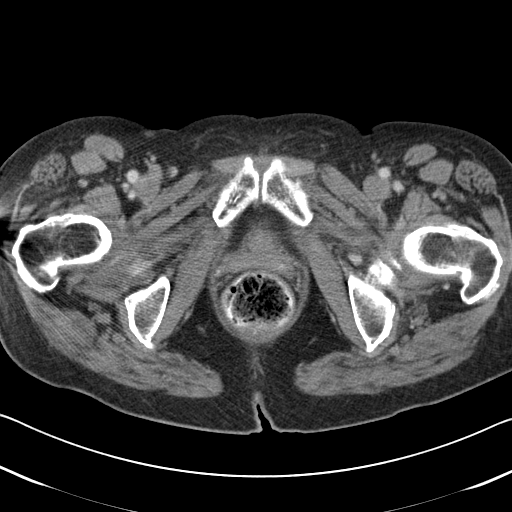
[im 8/126  bone]
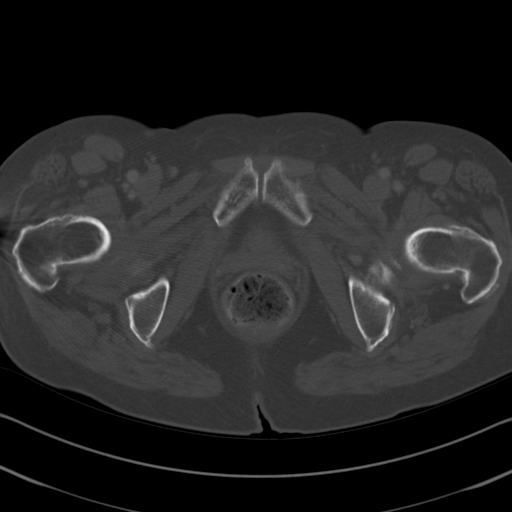
[im 16/126  soft-tissue]
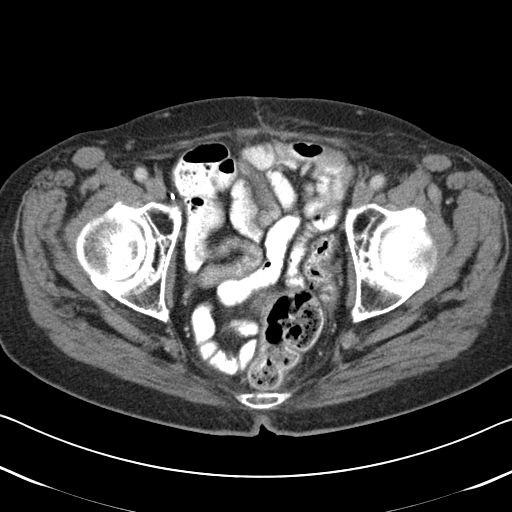
[im 24/126  soft-tissue]
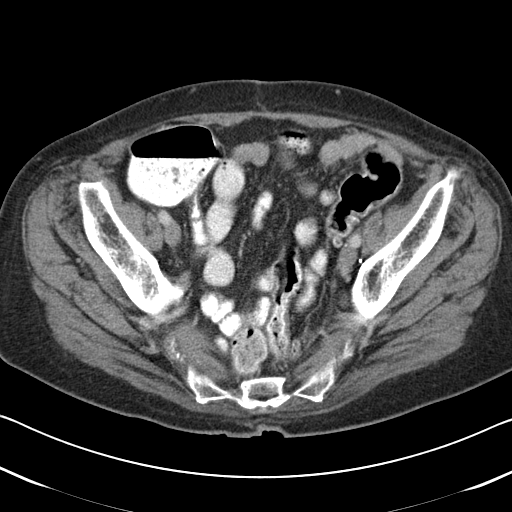
[im 40/126  soft-tissue]
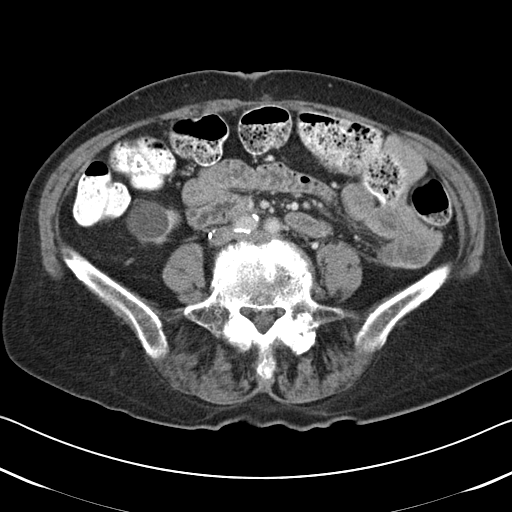
[im 47/126  soft-tissue]
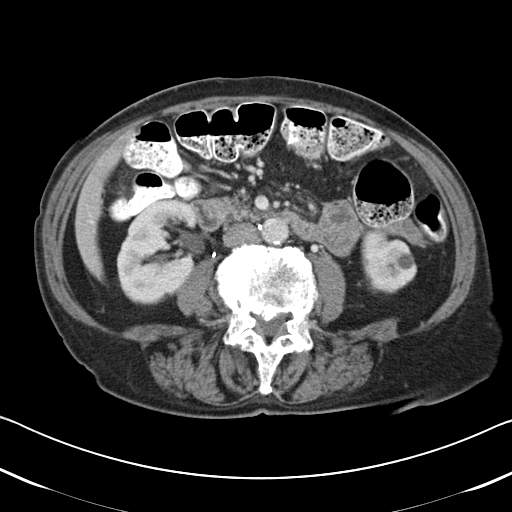
[im 55/126  soft-tissue]
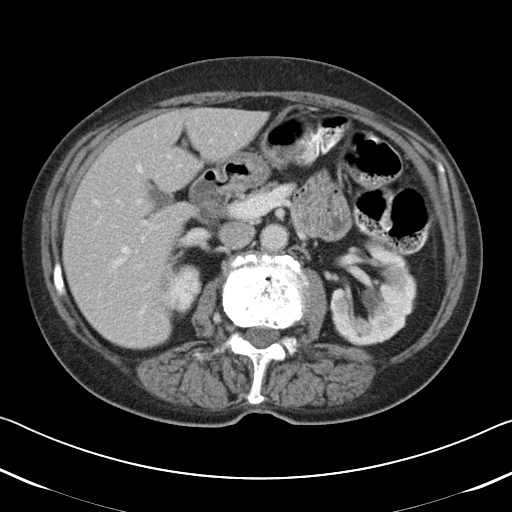
[im 63/126  soft-tissue]
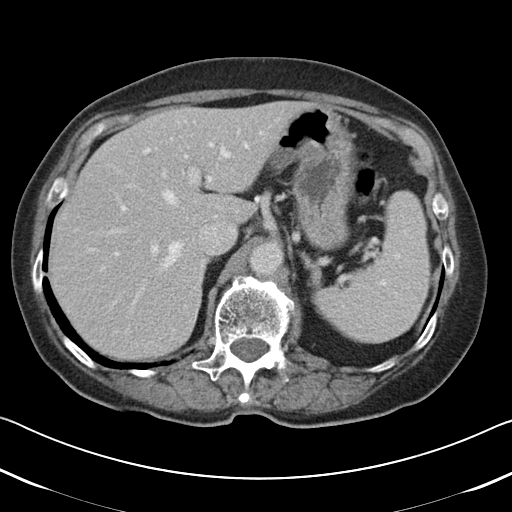
[im 71/126  soft-tissue]
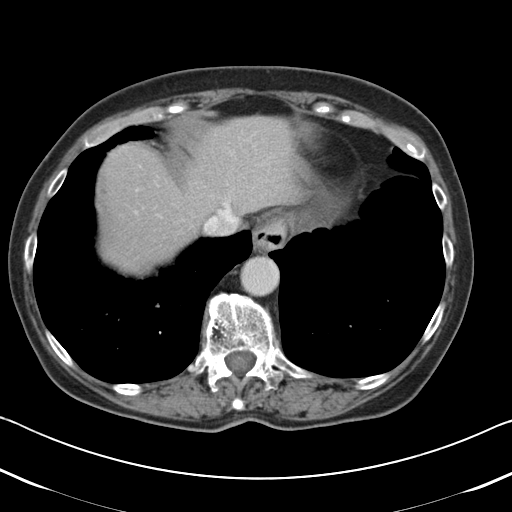
[im 79/126  soft-tissue]
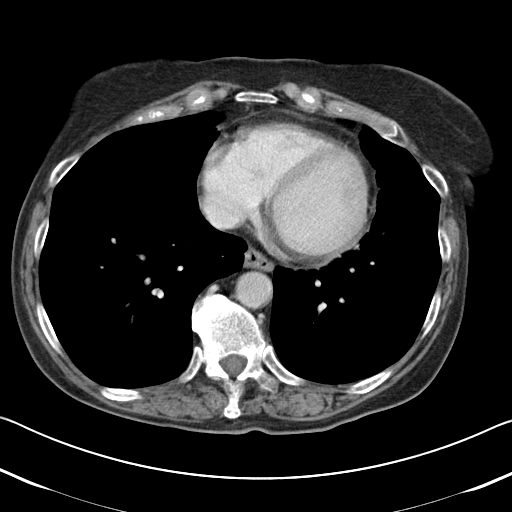
[im 79/126  bone]
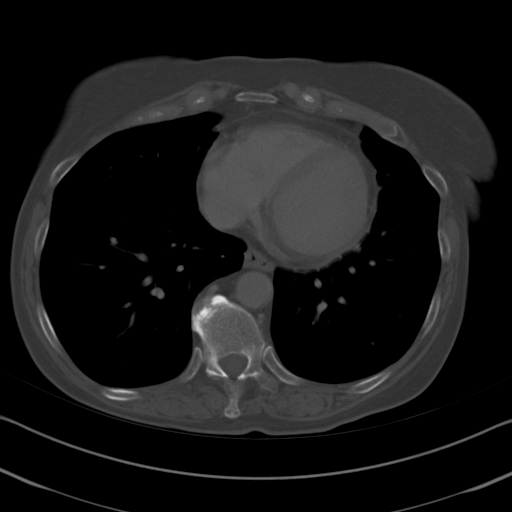
[im 86/126  soft-tissue]
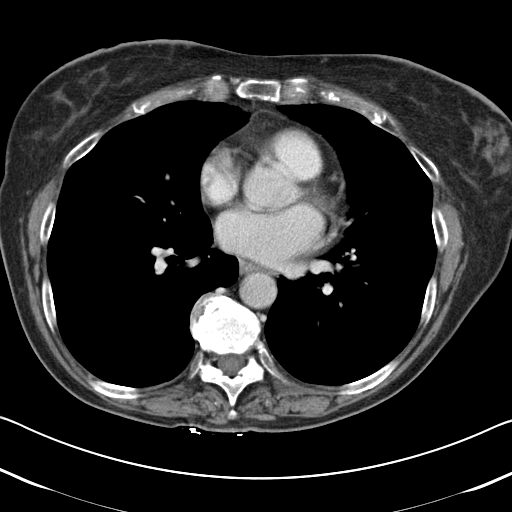
[im 102/126  soft-tissue]
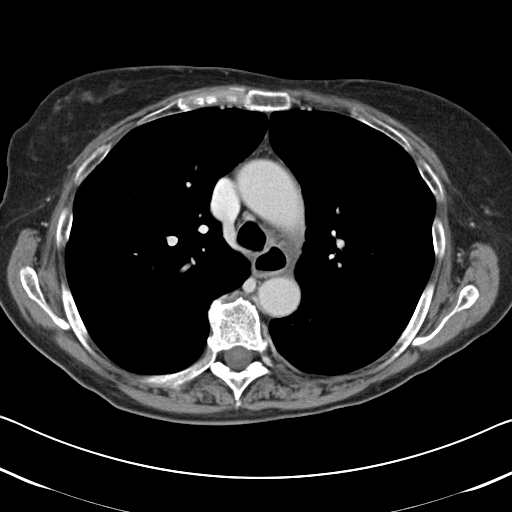
[im 110/126  soft-tissue]
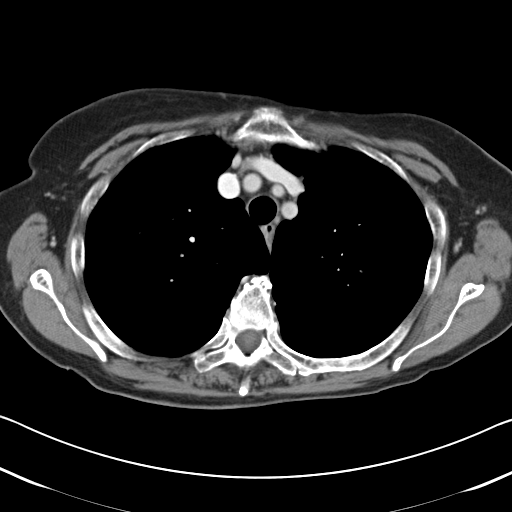
[im 118/126  soft-tissue]
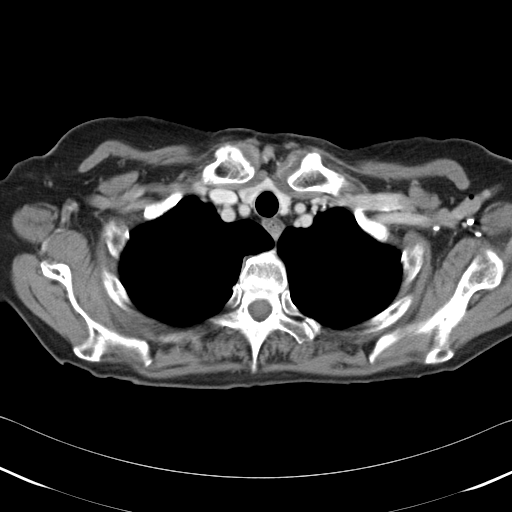

[Series 602: <mpr thick range> · coronal · 1.22mm/px · 3 of 70 slices shown]
[im 18/70  soft-tissue]
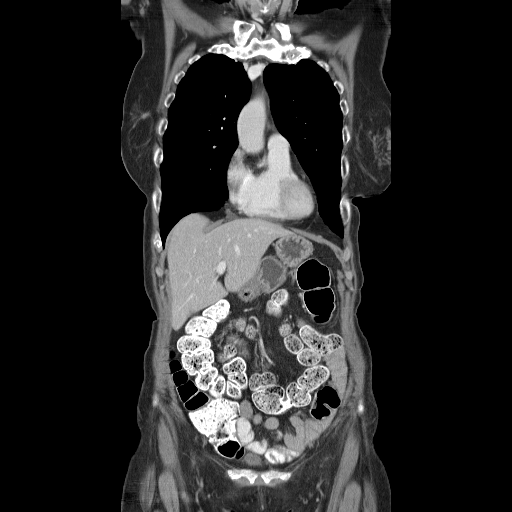
[im 35/70  soft-tissue]
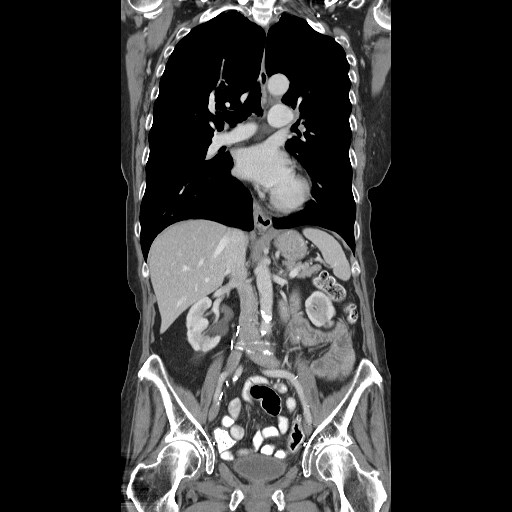
[im 52/70  soft-tissue]
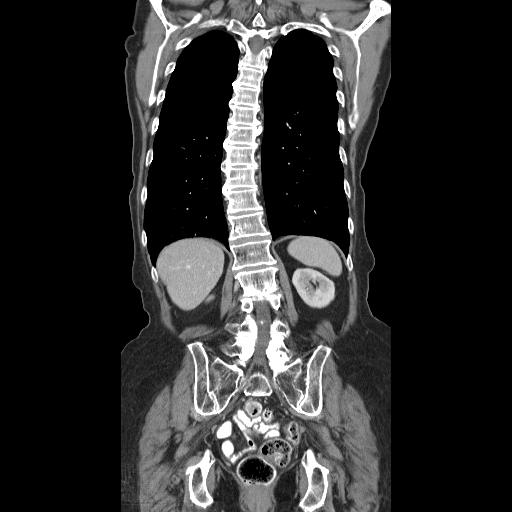

[16 of 46 positions shown; findings below may reference images not displayed]

FINDINGS: Further decrease in mediastinal adenopathy in the AP
window is seen since prior study, and this is no longer measurable.
Decreased adenopathy in the prevascular region along the lateral
aortic arch is also seen now measuring 6 mm in short axis compared
with 10 mm on prior exam.  Small less than 1 cm hilar lymph nodes
remain stable.  No new or enlarging areas of adenopathy are
identified.

There is no evidence of pleural or pericardial effusion.  Mild
emphysema is noted but no suspicious pulmonary nodules or masses
are identified.  There is no evidence of pulmonary infiltrate.
IMPRESSION: 1.  Further decrease in mediastinal lymphadenopathy.
2.  No new or progressive disease within the thorax.
3.  Mild pulmonary emphysema.

CT ABDOMEN
FINDINGS: Tiny less than 1 cm splenic cyst remains stable.  No
other splenic lesions are identified there is no evidence of
splenomegaly.  The liver, gallbladder, pancreas, and adrenal glands
are normal in appearance.  Small bilateral renal cysts and renal
parenchymal scarring remain unchanged. Surgical clips from prior
retroperitoneal lymph node dissection are noted.

There is no evidence of abdominal lymphadenopathy.  There is no
evidence of inflammatory process or abnormal fluid collections.
There is no evidence of dilated bowel loops.
IMPRESSION: Stable abdomen CT.  No evidence of metastatic disease or other
acute findings.

CT PELVIS
FINDINGS: The patient has undergone prior hysterectomy and
bilateral iliac lymph node dissection.  There is no evidence of
soft tissue masses or lymphadenopathy in the pelvis.  There is no
evidence of ascites or inflammatory process.  Pelvic bowel loops
are unremarkable in appearance.  No suspicious bone lesions are
identified.
IMPRESSION: Negative.  No evidence of pelvic metastatic disease or other acute
findings.

## 2009-02-02 ENCOUNTER — Ambulatory Visit (HOSPITAL_COMMUNITY): Admission: RE | Admit: 2009-02-02 | Discharge: 2009-02-02 | Payer: Self-pay | Admitting: Internal Medicine

## 2009-02-02 LAB — CBC WITH DIFFERENTIAL/PLATELET
BASO%: 0.3 % (ref 0.0–2.0)
Eosinophils Absolute: 0.1 10*3/uL (ref 0.0–0.5)
HCT: 43.7 % (ref 34.8–46.6)
MCHC: 34 g/dL (ref 31.5–36.0)
MONO#: 0.5 10*3/uL (ref 0.1–0.9)
NEUT#: 3.5 10*3/uL (ref 1.5–6.5)
NEUT%: 67.5 % (ref 38.4–76.8)
RBC: 4.4 10*6/uL (ref 3.70–5.45)
WBC: 5.2 10*3/uL (ref 3.9–10.3)
lymph#: 1.1 10*3/uL (ref 0.9–3.3)

## 2009-02-02 LAB — COMPREHENSIVE METABOLIC PANEL
ALT: 17 U/L (ref 0–35)
CO2: 29 mEq/L (ref 19–32)
Calcium: 9.4 mg/dL (ref 8.4–10.5)
Chloride: 102 mEq/L (ref 96–112)
Glucose, Bld: 110 mg/dL — ABNORMAL HIGH (ref 70–99)
Sodium: 140 mEq/L (ref 135–145)
Total Protein: 6.4 g/dL (ref 6.0–8.3)

## 2009-02-09 ENCOUNTER — Encounter: Payer: Self-pay | Admitting: Gastroenterology

## 2009-06-06 ENCOUNTER — Ambulatory Visit: Payer: Self-pay | Admitting: Internal Medicine

## 2009-06-08 ENCOUNTER — Ambulatory Visit (HOSPITAL_COMMUNITY): Admission: RE | Admit: 2009-06-08 | Discharge: 2009-06-08 | Payer: Self-pay | Admitting: Internal Medicine

## 2009-06-08 LAB — CBC WITH DIFFERENTIAL/PLATELET
BASO%: 0.3 % (ref 0.0–2.0)
Basophils Absolute: 0 10*3/uL (ref 0.0–0.1)
Eosinophils Absolute: 0.1 10*3/uL (ref 0.0–0.5)
HCT: 42 % (ref 34.8–46.6)
HGB: 14.1 g/dL (ref 11.6–15.9)
LYMPH%: 22.7 % (ref 14.0–49.7)
MCHC: 33.5 g/dL (ref 31.5–36.0)
MONO#: 0.4 10*3/uL (ref 0.1–0.9)
NEUT#: 2.3 10*3/uL (ref 1.5–6.5)
NEUT%: 62.9 % (ref 38.4–76.8)
Platelets: 214 10*3/uL (ref 145–400)
WBC: 3.6 10*3/uL — ABNORMAL LOW (ref 3.9–10.3)
lymph#: 0.8 10*3/uL — ABNORMAL LOW (ref 0.9–3.3)

## 2009-06-08 LAB — COMPREHENSIVE METABOLIC PANEL
ALT: 30 U/L (ref 0–35)
CO2: 28 mEq/L (ref 19–32)
Calcium: 9.5 mg/dL (ref 8.4–10.5)
Chloride: 105 mEq/L (ref 96–112)
Creatinine, Ser: 0.76 mg/dL (ref 0.40–1.20)
Glucose, Bld: 87 mg/dL (ref 70–99)
Total Bilirubin: 0.8 mg/dL (ref 0.3–1.2)

## 2009-06-09 ENCOUNTER — Encounter: Admission: RE | Admit: 2009-06-09 | Discharge: 2009-06-09 | Payer: Self-pay | Admitting: Internal Medicine

## 2009-06-13 ENCOUNTER — Encounter: Payer: Self-pay | Admitting: Gastroenterology

## 2009-06-13 ENCOUNTER — Encounter: Payer: Self-pay | Admitting: Internal Medicine

## 2009-06-13 ENCOUNTER — Ambulatory Visit: Admission: RE | Admit: 2009-06-13 | Discharge: 2009-06-13 | Payer: Self-pay | Admitting: Internal Medicine

## 2009-06-24 ENCOUNTER — Ambulatory Visit: Payer: Self-pay | Admitting: Vascular Surgery

## 2009-06-28 ENCOUNTER — Ambulatory Visit (HOSPITAL_BASED_OUTPATIENT_CLINIC_OR_DEPARTMENT_OTHER): Admission: RE | Admit: 2009-06-28 | Discharge: 2009-06-29 | Payer: Self-pay | Admitting: Surgery

## 2009-06-28 ENCOUNTER — Encounter (INDEPENDENT_AMBULATORY_CARE_PROVIDER_SITE_OTHER): Payer: Self-pay | Admitting: Surgery

## 2009-07-06 ENCOUNTER — Ambulatory Visit: Payer: Self-pay | Admitting: Internal Medicine

## 2009-07-08 ENCOUNTER — Ambulatory Visit: Admission: RE | Admit: 2009-07-08 | Discharge: 2009-07-08 | Payer: Self-pay | Admitting: Gynecology

## 2009-07-08 ENCOUNTER — Encounter: Payer: Self-pay | Admitting: Gynecology

## 2009-07-08 ENCOUNTER — Other Ambulatory Visit: Admission: RE | Admit: 2009-07-08 | Discharge: 2009-07-08 | Payer: Self-pay | Admitting: Gynecology

## 2009-07-11 LAB — CBC WITH DIFFERENTIAL/PLATELET
Eosinophils Absolute: 0.1 10*3/uL (ref 0.0–0.5)
HCT: 43.3 % (ref 34.8–46.6)
LYMPH%: 21.1 % (ref 14.0–49.7)
MCHC: 33.8 g/dL (ref 31.5–36.0)
MCV: 99.3 fL (ref 79.5–101.0)
MONO#: 0.3 10*3/uL (ref 0.1–0.9)
MONO%: 8 % (ref 0.0–14.0)
NEUT#: 3 10*3/uL (ref 1.5–6.5)
NEUT%: 68 % (ref 38.4–76.8)
Platelets: 194 10*3/uL (ref 145–400)
RBC: 4.36 10*6/uL (ref 3.70–5.45)
WBC: 4.3 10*3/uL (ref 3.9–10.3)

## 2009-07-11 LAB — CANCER ANTIGEN 27.29: CA 27.29: 23 U/mL (ref 0–39)

## 2009-07-11 LAB — COMPREHENSIVE METABOLIC PANEL
Alkaline Phosphatase: 78 U/L (ref 39–117)
CO2: 29 mEq/L (ref 19–32)
Creatinine, Ser: 0.81 mg/dL (ref 0.40–1.20)
Glucose, Bld: 167 mg/dL — ABNORMAL HIGH (ref 70–99)
Sodium: 143 mEq/L (ref 135–145)
Total Bilirubin: 0.7 mg/dL (ref 0.3–1.2)
Total Protein: 5.7 g/dL — ABNORMAL LOW (ref 6.0–8.3)

## 2009-07-18 ENCOUNTER — Encounter: Payer: Self-pay | Admitting: Gastroenterology

## 2009-08-05 ENCOUNTER — Ambulatory Visit: Payer: Self-pay | Admitting: Internal Medicine

## 2009-08-09 ENCOUNTER — Encounter: Payer: Self-pay | Admitting: Gastroenterology

## 2009-08-09 LAB — CBC WITH DIFFERENTIAL/PLATELET
Basophils Absolute: 0 10*3/uL (ref 0.0–0.1)
EOS%: 2 % (ref 0.0–7.0)
Eosinophils Absolute: 0.1 10*3/uL (ref 0.0–0.5)
HCT: 46.9 % — ABNORMAL HIGH (ref 34.8–46.6)
HGB: 15.7 g/dL (ref 11.6–15.9)
LYMPH%: 29.4 % (ref 14.0–49.7)
MCH: 33.9 pg (ref 25.1–34.0)
MCV: 101.5 fL — ABNORMAL HIGH (ref 79.5–101.0)
MONO%: 11.5 % (ref 0.0–14.0)
NEUT%: 56.7 % (ref 38.4–76.8)
Platelets: 208 10*3/uL (ref 145–400)

## 2009-08-09 LAB — COMPREHENSIVE METABOLIC PANEL
AST: 11 U/L (ref 0–37)
Alkaline Phosphatase: 75 U/L (ref 39–117)
BUN: 16 mg/dL (ref 6–23)
Creatinine, Ser: 0.95 mg/dL (ref 0.40–1.20)
Glucose, Bld: 105 mg/dL — ABNORMAL HIGH (ref 70–99)

## 2009-08-10 LAB — BASIC METABOLIC PANEL
BUN: 16 mg/dL (ref 6–23)
Chloride: 105 mEq/L (ref 96–112)
Glucose, Bld: 113 mg/dL — ABNORMAL HIGH (ref 70–99)
Potassium: 4.8 mEq/L (ref 3.5–5.3)
Sodium: 141 mEq/L (ref 135–145)

## 2009-09-02 ENCOUNTER — Ambulatory Visit: Payer: Self-pay | Admitting: Internal Medicine

## 2009-09-07 ENCOUNTER — Encounter: Payer: Self-pay | Admitting: Gastroenterology

## 2009-09-07 LAB — COMPREHENSIVE METABOLIC PANEL
ALT: 12 U/L (ref 0–35)
AST: 15 U/L (ref 0–37)
Albumin: 4.1 g/dL (ref 3.5–5.2)
Alkaline Phosphatase: 65 U/L (ref 39–117)
BUN: 18 mg/dL (ref 6–23)
CO2: 25 mEq/L (ref 19–32)
Calcium: 9.6 mg/dL (ref 8.4–10.5)
Chloride: 106 mEq/L (ref 96–112)
Creatinine, Ser: 0.88 mg/dL (ref 0.40–1.20)
Glucose, Bld: 159 mg/dL — ABNORMAL HIGH (ref 70–99)
Potassium: 4.4 mEq/L (ref 3.5–5.3)
Sodium: 143 mEq/L (ref 135–145)
Total Bilirubin: 0.6 mg/dL (ref 0.3–1.2)
Total Protein: 6.1 g/dL (ref 6.0–8.3)

## 2009-09-07 LAB — CBC WITH DIFFERENTIAL/PLATELET
BASO%: 0.3 % (ref 0.0–2.0)
EOS%: 1.4 % (ref 0.0–7.0)
HCT: 44.6 % (ref 34.8–46.6)
MCH: 33.5 pg (ref 25.1–34.0)
MCHC: 33 g/dL (ref 31.5–36.0)
MONO%: 8.9 % (ref 0.0–14.0)
NEUT%: 66.4 % (ref 38.4–76.8)
RDW: 14.4 % (ref 11.2–14.5)
lymph#: 0.8 10*3/uL — ABNORMAL LOW (ref 0.9–3.3)

## 2009-10-03 ENCOUNTER — Ambulatory Visit: Payer: Self-pay | Admitting: Internal Medicine

## 2009-10-05 ENCOUNTER — Encounter: Payer: Self-pay | Admitting: Gastroenterology

## 2009-10-05 LAB — COMPREHENSIVE METABOLIC PANEL
ALT: 20 U/L (ref 0–35)
CO2: 21 mEq/L (ref 19–32)
Calcium: 9.2 mg/dL (ref 8.4–10.5)
Chloride: 106 mEq/L (ref 96–112)
Sodium: 140 mEq/L (ref 135–145)
Total Protein: 6.1 g/dL (ref 6.0–8.3)

## 2009-10-05 LAB — CBC WITH DIFFERENTIAL/PLATELET
BASO%: 0.1 % (ref 0.0–2.0)
MCHC: 33.8 g/dL (ref 31.5–36.0)
MONO#: 0.4 10*3/uL (ref 0.1–0.9)
RBC: 4.35 10*6/uL (ref 3.70–5.45)
WBC: 4 10*3/uL (ref 3.9–10.3)
lymph#: 1.1 10*3/uL (ref 0.9–3.3)

## 2009-10-05 LAB — CANCER ANTIGEN 27.29: CA 27.29: 21 U/mL (ref 0–39)

## 2009-10-11 ENCOUNTER — Ambulatory Visit (HOSPITAL_COMMUNITY): Admission: RE | Admit: 2009-10-11 | Discharge: 2009-10-11 | Payer: Self-pay | Admitting: Internal Medicine

## 2009-10-13 ENCOUNTER — Encounter: Payer: Self-pay | Admitting: Gastroenterology

## 2009-11-14 ENCOUNTER — Ambulatory Visit: Payer: Self-pay | Admitting: Internal Medicine

## 2009-11-16 ENCOUNTER — Ambulatory Visit (HOSPITAL_COMMUNITY): Admission: RE | Admit: 2009-11-16 | Discharge: 2009-11-16 | Payer: Self-pay | Admitting: Internal Medicine

## 2009-11-16 LAB — CBC WITH DIFFERENTIAL/PLATELET
Basophils Absolute: 0 10*3/uL (ref 0.0–0.1)
Eosinophils Absolute: 0 10*3/uL (ref 0.0–0.5)
HGB: 15.8 g/dL (ref 11.6–15.9)
LYMPH%: 18.7 % (ref 14.0–49.7)
MCV: 102.2 fL — ABNORMAL HIGH (ref 79.5–101.0)
MONO%: 7.3 % (ref 0.0–14.0)
NEUT#: 3.5 10*3/uL (ref 1.5–6.5)
NEUT%: 72.7 % (ref 38.4–76.8)
Platelets: 196 10*3/uL (ref 145–400)

## 2009-11-16 LAB — COMPREHENSIVE METABOLIC PANEL
Alkaline Phosphatase: 65 U/L (ref 39–117)
BUN: 16 mg/dL (ref 6–23)
Glucose, Bld: 99 mg/dL (ref 70–99)
Total Bilirubin: 1.1 mg/dL (ref 0.3–1.2)

## 2009-12-30 ENCOUNTER — Ambulatory Visit: Payer: Self-pay | Admitting: Internal Medicine

## 2010-01-03 ENCOUNTER — Ambulatory Visit (HOSPITAL_COMMUNITY): Admission: RE | Admit: 2010-01-03 | Discharge: 2010-01-03 | Payer: Self-pay | Admitting: Internal Medicine

## 2010-01-03 LAB — CBC WITH DIFFERENTIAL/PLATELET
Basophils Absolute: 0 10*3/uL (ref 0.0–0.1)
EOS%: 0.9 % (ref 0.0–7.0)
HGB: 15.6 g/dL (ref 11.6–15.9)
MCH: 34.1 pg — ABNORMAL HIGH (ref 25.1–34.0)
MCHC: 33.5 g/dL (ref 31.5–36.0)
MCV: 101.9 fL — ABNORMAL HIGH (ref 79.5–101.0)
MONO%: 8 % (ref 0.0–14.0)
RDW: 14.1 % (ref 11.2–14.5)

## 2010-01-03 LAB — COMPREHENSIVE METABOLIC PANEL
AST: 22 U/L (ref 0–37)
Albumin: 3.9 g/dL (ref 3.5–5.2)
Alkaline Phosphatase: 65 U/L (ref 39–117)
BUN: 10 mg/dL (ref 6–23)
Creatinine, Ser: 0.79 mg/dL (ref 0.40–1.20)
Potassium: 4.7 mEq/L (ref 3.5–5.3)

## 2010-01-10 ENCOUNTER — Encounter: Payer: Self-pay | Admitting: Gastroenterology

## 2010-01-25 ENCOUNTER — Encounter: Payer: Self-pay | Admitting: Internal Medicine

## 2010-01-25 ENCOUNTER — Ambulatory Visit: Admission: RE | Admit: 2010-01-25 | Discharge: 2010-01-25 | Payer: Self-pay | Admitting: Internal Medicine

## 2010-01-25 ENCOUNTER — Ambulatory Visit: Payer: Self-pay | Admitting: Surgery

## 2010-04-06 ENCOUNTER — Ambulatory Visit: Payer: Self-pay | Admitting: Internal Medicine

## 2010-04-10 ENCOUNTER — Ambulatory Visit (HOSPITAL_COMMUNITY): Admission: RE | Admit: 2010-04-10 | Discharge: 2010-04-10 | Payer: Self-pay | Admitting: Internal Medicine

## 2010-04-10 LAB — COMPREHENSIVE METABOLIC PANEL
ALT: 21 U/L (ref 0–35)
AST: 28 U/L (ref 0–37)
Albumin: 4 g/dL (ref 3.5–5.2)
Calcium: 9.1 mg/dL (ref 8.4–10.5)
Chloride: 103 mEq/L (ref 96–112)
Creatinine, Ser: 0.75 mg/dL (ref 0.40–1.20)
Potassium: 4.2 mEq/L (ref 3.5–5.3)
Sodium: 139 mEq/L (ref 135–145)
Total Protein: 6.5 g/dL (ref 6.0–8.3)

## 2010-04-10 LAB — CBC WITH DIFFERENTIAL/PLATELET
BASO%: 0.3 % (ref 0.0–2.0)
EOS%: 1.5 % (ref 0.0–7.0)
MCH: 34.3 pg — ABNORMAL HIGH (ref 25.1–34.0)
MCHC: 34 g/dL (ref 31.5–36.0)
MONO#: 0.5 10*3/uL (ref 0.1–0.9)
RDW: 14.1 % (ref 11.2–14.5)
WBC: 5.6 10*3/uL (ref 3.9–10.3)
lymph#: 1.1 10*3/uL (ref 0.9–3.3)

## 2010-04-12 ENCOUNTER — Encounter: Payer: Self-pay | Admitting: Gastroenterology

## 2010-04-18 ENCOUNTER — Emergency Department (HOSPITAL_COMMUNITY): Admission: EM | Admit: 2010-04-18 | Discharge: 2010-04-18 | Payer: Self-pay | Admitting: Emergency Medicine

## 2010-06-14 ENCOUNTER — Emergency Department (HOSPITAL_COMMUNITY): Admission: EM | Admit: 2010-06-14 | Discharge: 2010-06-14 | Payer: Self-pay | Admitting: Emergency Medicine

## 2010-06-22 ENCOUNTER — Ambulatory Visit: Admission: RE | Admit: 2010-06-22 | Discharge: 2010-06-22 | Payer: Self-pay | Admitting: Gynecologic Oncology

## 2010-06-22 ENCOUNTER — Other Ambulatory Visit: Admission: RE | Admit: 2010-06-22 | Discharge: 2010-06-22 | Payer: Self-pay | Admitting: Gynecologic Oncology

## 2010-07-06 ENCOUNTER — Ambulatory Visit: Payer: Self-pay | Admitting: Internal Medicine

## 2010-07-10 ENCOUNTER — Ambulatory Visit (HOSPITAL_COMMUNITY)
Admission: RE | Admit: 2010-07-10 | Discharge: 2010-07-10 | Payer: Self-pay | Source: Home / Self Care | Admitting: Internal Medicine

## 2010-07-10 LAB — COMPREHENSIVE METABOLIC PANEL
Albumin: 4.1 g/dL (ref 3.5–5.2)
BUN: 12 mg/dL (ref 6–23)
CO2: 28 mEq/L (ref 19–32)
Glucose, Bld: 101 mg/dL — ABNORMAL HIGH (ref 70–99)
Potassium: 4.8 mEq/L (ref 3.5–5.3)
Sodium: 140 mEq/L (ref 135–145)
Total Bilirubin: 0.7 mg/dL (ref 0.3–1.2)
Total Protein: 6.2 g/dL (ref 6.0–8.3)

## 2010-07-10 LAB — CBC WITH DIFFERENTIAL/PLATELET
Basophils Absolute: 0 10*3/uL (ref 0.0–0.1)
Eosinophils Absolute: 0.1 10*3/uL (ref 0.0–0.5)
HGB: 14.8 g/dL (ref 11.6–15.9)
LYMPH%: 19.7 % (ref 14.0–49.7)
MCV: 100.3 fL (ref 79.5–101.0)
MONO#: 0.5 10*3/uL (ref 0.1–0.9)
MONO%: 9.3 % (ref 0.0–14.0)
NEUT#: 3.4 10*3/uL (ref 1.5–6.5)
Platelets: 211 10*3/uL (ref 145–400)
RBC: 4.49 10*6/uL (ref 3.70–5.45)
WBC: 4.9 10*3/uL (ref 3.9–10.3)

## 2010-07-10 LAB — CANCER ANTIGEN 27.29: CA 27.29: 19 U/mL (ref 0–39)

## 2010-07-10 LAB — LACTATE DEHYDROGENASE: LDH: 155 U/L (ref 94–250)

## 2010-07-13 ENCOUNTER — Encounter: Payer: Self-pay | Admitting: Gastroenterology

## 2010-08-07 ENCOUNTER — Emergency Department (HOSPITAL_COMMUNITY): Admission: EM | Admit: 2010-08-07 | Discharge: 2010-08-07 | Payer: Self-pay | Admitting: Emergency Medicine

## 2010-11-05 ENCOUNTER — Encounter: Payer: Self-pay | Admitting: Internal Medicine

## 2010-11-05 ENCOUNTER — Encounter: Payer: Self-pay | Admitting: Thoracic Surgery

## 2010-11-08 ENCOUNTER — Ambulatory Visit: Payer: Self-pay | Admitting: Internal Medicine

## 2010-11-10 ENCOUNTER — Ambulatory Visit (HOSPITAL_COMMUNITY)
Admission: RE | Admit: 2010-11-10 | Discharge: 2010-11-10 | Payer: Self-pay | Source: Home / Self Care | Attending: Internal Medicine | Admitting: Internal Medicine

## 2010-11-10 LAB — CMP (CANCER CENTER ONLY)
ALT(SGPT): 23 U/L (ref 10–47)
CO2: 28 mEq/L (ref 18–33)
Calcium: 9 mg/dL (ref 8.0–10.3)
Chloride: 104 mEq/L (ref 98–108)
Creat: 0.7 mg/dl (ref 0.6–1.2)
Sodium: 144 mEq/L (ref 128–145)
Total Protein: 5.8 g/dL — ABNORMAL LOW (ref 6.4–8.1)

## 2010-11-10 LAB — CBC WITH DIFFERENTIAL/PLATELET
BASO%: 0.5 % (ref 0.0–2.0)
HCT: 47.7 % — ABNORMAL HIGH (ref 34.8–46.6)
MCHC: 33.6 g/dL (ref 31.5–36.0)
MONO#: 0.4 10*3/uL (ref 0.1–0.9)
NEUT#: 2.5 10*3/uL (ref 1.5–6.5)
NEUT%: 67.3 % (ref 38.4–76.8)
WBC: 3.7 10*3/uL — ABNORMAL LOW (ref 3.9–10.3)
lymph#: 0.8 10*3/uL — ABNORMAL LOW (ref 0.9–3.3)

## 2010-11-10 LAB — CANCER ANTIGEN 27.29: CA 27.29: 25 U/mL (ref 0–39)

## 2010-11-14 ENCOUNTER — Other Ambulatory Visit: Payer: Self-pay | Admitting: Internal Medicine

## 2010-11-14 ENCOUNTER — Encounter: Payer: Self-pay | Admitting: Gastroenterology

## 2010-11-14 DIAGNOSIS — C50919 Malignant neoplasm of unspecified site of unspecified female breast: Secondary | ICD-10-CM

## 2010-11-16 NOTE — Letter (Signed)
[facility]   Imported By: Lester Pepeekeo 11/12/2009 09:37:07  _____________________________________________________________________  External Attachment:    Type:   Image     Comment:   External Document

## 2010-11-16 NOTE — Letter (Signed)
[facility]   Imported By: Lester Breese 11/12/2009 09:11:25  _____________________________________________________________________  External Attachment:    Type:   Image     Comment:   External Document

## 2010-11-16 NOTE — Letter (Signed)
[facility]   Imported By: Sherian Rein 01/31/2010 07:25:58  _____________________________________________________________________  External Attachment:    Type:   Image     Comment:   External Document

## 2010-11-16 NOTE — Letter (Signed)
Summary: [facility]  [facility]   Imported By: Lennie Odor 08/01/2010 16:21:50  _____________________________________________________________________  External Attachment:    Type:   Image     Comment:   External Document

## 2010-11-16 NOTE — Letter (Signed)
[facility]   Imported By: Lester Simms 11/12/2009 09:27:39  _____________________________________________________________________  External Attachment:    Type:   Image     Comment:   External Document

## 2010-11-16 NOTE — Letter (Signed)
[facility]   Imported By: Sherian Rein 05/15/2010 14:21:46  _____________________________________________________________________  External Attachment:    Type:   Image     Comment:   External Document

## 2010-12-21 NOTE — Letter (Signed)
Summary: [facility]  [facility]   Imported By: Sherian Rein 12/12/2010 07:30:14  _____________________________________________________________________  External Attachment:    Type:   Image     Comment:   External Document

## 2010-12-27 LAB — BASIC METABOLIC PANEL
CO2: 29 mEq/L (ref 19–32)
GFR calc non Af Amer: >60 mL/min (ref >60)
Glucose, Bld: 96 mg/dL (ref 70–99)
Potassium: 4.6 mEq/L (ref 3.5–5.1)
Sodium: 143 mEq/L (ref 135–145)

## 2010-12-27 LAB — CBC
HCT: 46.9 % — ABNORMAL HIGH (ref 36.0–46.0)
Hemoglobin: 15.8 g/dL — ABNORMAL HIGH (ref 12.0–15.0)
MCHC: 33.7 g/dL (ref 30.0–36.0)

## 2010-12-27 LAB — DIFFERENTIAL
Basophils Relative: 0 % (ref 0–1)
Lymphs Abs: 0.8 10*3/uL (ref 0.7–4.0)
Monocytes Absolute: 0.4 10*3/uL (ref 0.1–1.0)
Monocytes Relative: 8 % (ref 3–12)
Neutro Abs: 3.9 10*3/uL (ref 1.7–7.7)

## 2010-12-28 LAB — DIFFERENTIAL
Basophils Absolute: 0 10*3/uL (ref 0.0–0.1)
Eosinophils Absolute: 0 10*3/uL (ref 0.0–0.7)
Eosinophils Relative: 1 % (ref 0–5)
Lymphocytes Relative: 12 % (ref 12–46)
Lymphs Abs: 0.8 10*3/uL (ref 0.7–4.0)
Neutrophils Relative %: 79 % — ABNORMAL HIGH (ref 43–77)

## 2010-12-28 LAB — CBC
HCT: 44.4 % (ref 36.0–46.0)
Hemoglobin: 15.4 g/dL — ABNORMAL HIGH (ref 12.0–15.0)
MCH: 34.8 pg — ABNORMAL HIGH (ref 26.0–34.0)
MCHC: 34.7 g/dL (ref 30.0–36.0)
MCV: 100.4 fL — ABNORMAL HIGH (ref 78.0–100.0)
RBC: 4.43 MIL/uL (ref 3.87–5.11)

## 2010-12-28 LAB — COMPREHENSIVE METABOLIC PANEL
CO2: 29 mEq/L (ref 19–32)
Calcium: 9 mg/dL (ref 8.4–10.5)
Chloride: 106 mEq/L (ref 96–112)
Creatinine, Ser: 0.92 mg/dL (ref 0.4–1.2)
GFR calc non Af Amer: 59 mL/min — ABNORMAL LOW (ref >60)
Glucose, Bld: 84 mg/dL (ref 70–99)
Total Bilirubin: 0.9 mg/dL (ref 0.3–1.2)

## 2010-12-28 LAB — URINALYSIS, ROUTINE W REFLEX MICROSCOPIC
Bilirubin Urine: NEGATIVE
Glucose, UA: NEGATIVE mg/dL
Hgb urine dipstick: NEGATIVE
Ketones, ur: NEGATIVE mg/dL
pH: 7 (ref 5.0–8.0)

## 2010-12-28 LAB — POCT I-STAT, CHEM 8
BUN: 11 mg/dL (ref 6–23)
Calcium, Ion: 1.06 mmol/L — ABNORMAL LOW (ref 1.12–1.32)
Chloride: 101 mEq/L (ref 96–112)
Creatinine, Ser: 0.9 mg/dL (ref 0.4–1.2)
Glucose, Bld: 102 mg/dL — ABNORMAL HIGH (ref 70–99)
HCT: 49 % — ABNORMAL HIGH (ref 36.0–46.0)

## 2010-12-28 LAB — LIPASE, BLOOD: Lipase: 28 U/L (ref 11–59)

## 2010-12-31 LAB — COMPREHENSIVE METABOLIC PANEL
ALT: 20 U/L (ref 0–35)
AST: 28 U/L (ref 0–37)
Albumin: 3.6 g/dL (ref 3.5–5.2)
Calcium: 8.8 mg/dL (ref 8.4–10.5)
Creatinine, Ser: 0.78 mg/dL (ref 0.4–1.2)
GFR calc Af Amer: >60 mL/min (ref >60)
GFR calc non Af Amer: >60 mL/min (ref >60)
Sodium: 141 mEq/L (ref 135–145)
Total Protein: 5.8 g/dL — ABNORMAL LOW (ref 6.0–8.3)

## 2010-12-31 LAB — CK TOTAL AND CKMB (NOT AT ARMC)
CK, MB: 10.2 ng/mL (ref 0.3–4.0)
CK, MB: 8.6 ng/mL (ref 0.3–4.0)
Relative Index: INVALID (ref 0.0–2.5)
Relative Index: INVALID (ref 0.0–2.5)
Total CK: 59 U/L (ref 7–177)
Total CK: 74 U/L (ref 7–177)

## 2010-12-31 LAB — TROPONIN I
Troponin I: 0.01 ng/mL (ref 0.00–0.06)
Troponin I: 0.02 ng/mL (ref 0.00–0.06)
Troponin I: 0.02 ng/mL (ref 0.00–0.06)

## 2010-12-31 LAB — URINALYSIS, ROUTINE W REFLEX MICROSCOPIC
Bilirubin Urine: NEGATIVE
Ketones, ur: NEGATIVE mg/dL
Nitrite: NEGATIVE
Specific Gravity, Urine: 1.01 (ref 1.005–1.030)
Urobilinogen, UA: 0.2 mg/dL (ref 0.0–1.0)
pH: 7.5 (ref 5.0–8.0)

## 2010-12-31 LAB — CBC
Hemoglobin: 15 g/dL (ref 12.0–15.0)
MCH: 34.4 pg — ABNORMAL HIGH (ref 26.0–34.0)
MCHC: 33.9 g/dL (ref 30.0–36.0)
Platelets: 193 10*3/uL (ref 150–400)
RDW: 14.1 % (ref 11.5–15.5)

## 2010-12-31 LAB — DIFFERENTIAL
Eosinophils Absolute: 0.1 10*3/uL (ref 0.0–0.7)
Eosinophils Relative: 1 % (ref 0–5)
Lymphocytes Relative: 18 % (ref 12–46)
Lymphs Abs: 1 10*3/uL (ref 0.7–4.0)
Monocytes Absolute: 0.5 10*3/uL (ref 0.1–1.0)
Monocytes Relative: 8 % (ref 3–12)

## 2010-12-31 LAB — APTT: aPTT: 26 seconds (ref 24–37)

## 2010-12-31 LAB — LIPASE, BLOOD: Lipase: 35 U/L (ref 11–59)

## 2011-01-08 ENCOUNTER — Emergency Department (HOSPITAL_COMMUNITY)
Admission: EM | Admit: 2011-01-08 | Discharge: 2011-01-08 | Disposition: A | Discharge status: Home / Self Care | Payer: Medicare Other | Attending: Emergency Medicine | Admitting: Emergency Medicine

## 2011-01-08 ENCOUNTER — Emergency Department (HOSPITAL_COMMUNITY): Payer: Medicare Other

## 2011-01-08 DIAGNOSIS — R51 Headache: Secondary | ICD-10-CM | POA: Insufficient documentation

## 2011-01-08 DIAGNOSIS — Z853 Personal history of malignant neoplasm of breast: Secondary | ICD-10-CM | POA: Insufficient documentation

## 2011-01-08 DIAGNOSIS — F411 Generalized anxiety disorder: Secondary | ICD-10-CM | POA: Insufficient documentation

## 2011-01-08 DIAGNOSIS — I10 Essential (primary) hypertension: Secondary | ICD-10-CM | POA: Insufficient documentation

## 2011-01-08 DIAGNOSIS — Z85118 Personal history of other malignant neoplasm of bronchus and lung: Secondary | ICD-10-CM | POA: Insufficient documentation

## 2011-01-08 DIAGNOSIS — G609 Hereditary and idiopathic neuropathy, unspecified: Secondary | ICD-10-CM | POA: Insufficient documentation

## 2011-01-08 DIAGNOSIS — R209 Unspecified disturbances of skin sensation: Secondary | ICD-10-CM | POA: Insufficient documentation

## 2011-01-08 LAB — URINALYSIS, ROUTINE W REFLEX MICROSCOPIC
Bilirubin Urine: NEGATIVE
Nitrite: NEGATIVE
Protein, ur: NEGATIVE mg/dL

## 2011-01-08 LAB — POCT I-STAT, CHEM 8
Calcium, Ion: 1.1 mmol/L — ABNORMAL LOW (ref 1.12–1.32)
Glucose, Bld: 96 mg/dL (ref 70–99)
HCT: 52 % — ABNORMAL HIGH (ref 36.0–46.0)
TCO2: 26 mmol/L (ref 0–100)

## 2011-01-19 LAB — COMPREHENSIVE METABOLIC PANEL
ALT: 16 U/L (ref 0–35)
Alkaline Phosphatase: 73 U/L (ref 39–117)
BUN: 15 mg/dL (ref 6–23)
CO2: 26 mEq/L (ref 19–32)
GFR calc non Af Amer: >60 mL/min (ref >60)
Glucose, Bld: 116 mg/dL — ABNORMAL HIGH (ref 70–99)
Potassium: 4.3 mEq/L (ref 3.5–5.1)
Sodium: 139 mEq/L (ref 135–145)
Total Bilirubin: 1 mg/dL (ref 0.3–1.2)

## 2011-01-19 LAB — DIFFERENTIAL
Basophils Absolute: 0 10*3/uL (ref 0.0–0.1)
Basophils Relative: 0 % (ref 0–1)
Eosinophils Absolute: 0 10*3/uL (ref 0.0–0.7)
Neutro Abs: 2.7 10*3/uL (ref 1.7–7.7)
Neutrophils Relative %: 65 % (ref 43–77)

## 2011-01-19 LAB — URINALYSIS, ROUTINE W REFLEX MICROSCOPIC
Bilirubin Urine: NEGATIVE
Hgb urine dipstick: NEGATIVE
Ketones, ur: 15 mg/dL — AB
Nitrite: NEGATIVE
Specific Gravity, Urine: 1.026 (ref 1.005–1.030)
Urobilinogen, UA: 0.2 mg/dL (ref 0.0–1.0)
pH: 6.5 (ref 5.0–8.0)

## 2011-01-19 LAB — URINE MICROSCOPIC-ADD ON

## 2011-01-19 LAB — CBC
HCT: 43.5 % (ref 36.0–46.0)
Hemoglobin: 14.7 g/dL (ref 12.0–15.0)
RBC: 4.32 MIL/uL (ref 3.87–5.11)
RDW: 13.4 % (ref 11.5–15.5)

## 2011-01-20 ENCOUNTER — Emergency Department (HOSPITAL_COMMUNITY): Payer: Medicare Other

## 2011-01-20 ENCOUNTER — Inpatient Hospital Stay (HOSPITAL_COMMUNITY)
Admission: EM | Admit: 2011-01-20 | Discharge: 2011-01-24 | DRG: 309 | Disposition: A | Discharge status: Skilled Nursing Facility | Payer: Medicare Other | Attending: Internal Medicine | Admitting: Internal Medicine

## 2011-01-20 DIAGNOSIS — I679 Cerebrovascular disease, unspecified: Secondary | ICD-10-CM | POA: Diagnosis present

## 2011-01-20 DIAGNOSIS — J449 Chronic obstructive pulmonary disease, unspecified: Secondary | ICD-10-CM | POA: Diagnosis present

## 2011-01-20 DIAGNOSIS — Z8542 Personal history of malignant neoplasm of other parts of uterus: Secondary | ICD-10-CM

## 2011-01-20 DIAGNOSIS — Z85118 Personal history of other malignant neoplasm of bronchus and lung: Secondary | ICD-10-CM

## 2011-01-20 DIAGNOSIS — K5900 Constipation, unspecified: Secondary | ICD-10-CM | POA: Diagnosis not present

## 2011-01-20 DIAGNOSIS — I10 Essential (primary) hypertension: Secondary | ICD-10-CM | POA: Diagnosis present

## 2011-01-20 DIAGNOSIS — F429 Obsessive-compulsive disorder, unspecified: Secondary | ICD-10-CM | POA: Diagnosis present

## 2011-01-20 DIAGNOSIS — J4489 Other specified chronic obstructive pulmonary disease: Secondary | ICD-10-CM | POA: Diagnosis present

## 2011-01-20 DIAGNOSIS — I4891 Unspecified atrial fibrillation: Principal | ICD-10-CM | POA: Diagnosis present

## 2011-01-20 DIAGNOSIS — G609 Hereditary and idiopathic neuropathy, unspecified: Secondary | ICD-10-CM | POA: Diagnosis present

## 2011-01-20 DIAGNOSIS — R339 Retention of urine, unspecified: Secondary | ICD-10-CM | POA: Diagnosis not present

## 2011-01-20 DIAGNOSIS — Z853 Personal history of malignant neoplasm of breast: Secondary | ICD-10-CM

## 2011-01-20 DIAGNOSIS — E876 Hypokalemia: Secondary | ICD-10-CM | POA: Diagnosis present

## 2011-01-20 DIAGNOSIS — N390 Urinary tract infection, site not specified: Secondary | ICD-10-CM | POA: Diagnosis present

## 2011-01-20 DIAGNOSIS — R5381 Other malaise: Secondary | ICD-10-CM | POA: Diagnosis present

## 2011-01-20 DIAGNOSIS — Z7982 Long term (current) use of aspirin: Secondary | ICD-10-CM

## 2011-01-20 LAB — POCT CARDIAC MARKERS
CKMB, poc: 1.1 ng/mL (ref 1.0–8.0)
Myoglobin, poc: 41.4 ng/mL (ref 12–200)

## 2011-01-20 LAB — DIFFERENTIAL
Basophils Absolute: 0 10*3/uL (ref 0.0–0.1)
Basophils Relative: 0 % (ref 0–1)
Lymphocytes Relative: 18 % (ref 12–46)
Monocytes Absolute: 0.7 10*3/uL (ref 0.1–1.0)
Neutro Abs: 4.3 10*3/uL (ref 1.7–7.7)
Neutrophils Relative %: 70 % (ref 43–77)

## 2011-01-20 LAB — BASIC METABOLIC PANEL
CO2: 27 mEq/L (ref 19–32)
Calcium: 9.3 mg/dL (ref 8.4–10.5)
Creatinine, Ser: 0.78 mg/dL (ref 0.4–1.2)
GFR calc Af Amer: >60 mL/min (ref >60)
GFR calc non Af Amer: >60 mL/min (ref >60)
Glucose, Bld: 112 mg/dL — ABNORMAL HIGH (ref 70–99)

## 2011-01-20 LAB — CBC
Hemoglobin: 16.3 g/dL — ABNORMAL HIGH (ref 12.0–15.0)
MCHC: 34.3 g/dL (ref 30.0–36.0)
WBC: 6.1 10*3/uL (ref 4.0–10.5)

## 2011-01-20 MED ORDER — IOHEXOL 300 MG/ML  SOLN
100.0000 mL | Freq: Once | INTRAMUSCULAR | Status: AC | PRN
  Administered 2011-01-20: 100 mL via INTRAVENOUS

## 2011-01-21 ENCOUNTER — Inpatient Hospital Stay (HOSPITAL_COMMUNITY): Payer: Medicare Other

## 2011-01-21 LAB — COMPREHENSIVE METABOLIC PANEL
CO2: 26 mEq/L (ref 19–32)
Calcium: 8.7 mg/dL (ref 8.4–10.5)
Creatinine, Ser: 0.68 mg/dL (ref 0.4–1.2)
GFR calc non Af Amer: >60 mL/min (ref >60)
Glucose, Bld: 107 mg/dL — ABNORMAL HIGH (ref 70–99)

## 2011-01-21 LAB — CK TOTAL AND CKMB (NOT AT ARMC): CK, MB: 4.1 ng/mL — ABNORMAL HIGH (ref 0.3–4.0)

## 2011-01-21 LAB — LIPID PANEL
LDL Cholesterol: 68 mg/dL (ref 0–99)
Total CHOL/HDL Ratio: 2.1 RATIO
VLDL: 11 mg/dL (ref 0–40)

## 2011-01-21 LAB — CARDIAC PANEL(CRET KIN+CKTOT+MB+TROPI)
Relative Index: INVALID (ref 0.0–2.5)
Total CK: 33 U/L (ref 7–177)
Total CK: 30 U/L (ref 7–177)
Troponin I: 0.02 ng/mL (ref 0.00–0.06)

## 2011-01-21 LAB — TSH: TSH: 2.139 u[IU]/mL (ref 0.350–4.500)

## 2011-01-21 LAB — HEMOGLOBIN A1C
Hgb A1c MFr Bld: 5.3 % (ref <5.7)
Mean Plasma Glucose: 105 mg/dL (ref <117)

## 2011-01-21 LAB — MAGNESIUM: Magnesium: 1.5 mg/dL (ref 1.5–2.5)

## 2011-01-21 LAB — PROTIME-INR: Prothrombin Time: 12.6 seconds (ref 11.6–15.2)

## 2011-01-22 LAB — CBC
HCT: 43.9 % (ref 36.0–46.0)
Hemoglobin: 14.3 g/dL (ref 12.0–15.0)
MCH: 31.4 pg (ref 26.0–34.0)
MCHC: 32.6 g/dL (ref 30.0–36.0)
MCV: 96.5 fL (ref 78.0–100.0)
RBC: 4.55 MIL/uL (ref 3.87–5.11)

## 2011-01-22 LAB — BASIC METABOLIC PANEL
BUN: 8 mg/dL (ref 6–23)
CO2: 27 mEq/L (ref 19–32)
Chloride: 107 mEq/L (ref 96–112)
GFR calc non Af Amer: >60 mL/min (ref >60)
Glucose, Bld: 104 mg/dL — ABNORMAL HIGH (ref 70–99)
Potassium: 3.9 mEq/L (ref 3.5–5.1)
Sodium: 138 mEq/L (ref 135–145)

## 2011-01-22 LAB — PROTIME-INR: Prothrombin Time: 20.2 seconds — ABNORMAL HIGH (ref 11.6–15.2)

## 2011-01-23 ENCOUNTER — Inpatient Hospital Stay (HOSPITAL_COMMUNITY): Payer: Medicare Other

## 2011-01-23 LAB — URINALYSIS, ROUTINE W REFLEX MICROSCOPIC
Bilirubin Urine: NEGATIVE
Glucose, UA: NEGATIVE mg/dL
Ketones, ur: NEGATIVE mg/dL
Nitrite: POSITIVE — AB
Specific Gravity, Urine: 1.011 (ref 1.005–1.030)
pH: 6.5 (ref 5.0–8.0)

## 2011-01-23 LAB — URINE MICROSCOPIC-ADD ON

## 2011-01-24 LAB — CBC
MCH: 32.1 pg (ref 26.0–34.0)
MCHC: 32.9 g/dL (ref 30.0–36.0)
Platelets: 178 10*3/uL (ref 150–400)
RBC: 4.33 MIL/uL (ref 3.87–5.11)

## 2011-01-25 LAB — URINE CULTURE
Colony Count: >=100000
Special Requests: NEGATIVE

## 2011-01-25 NOTE — H&P (Signed)
[name], [name]                 ACCOUNT NO.:  [id number]  MEDICAL RECORD NO.:  [id number]           PATIENT TYPE:  LOCATION:                                 FACILITY:  PHYSICIAN:  Kellie Overlie, MD       DATE OF BIRTH:  September 18, 1930  DATE OF ADMISSION:  01/21/2011 DATE OF DISCHARGE:                             HISTORY & PHYSICAL   PRIMARY CARE PHYSICIAN:  Kellie Curt. Chilton Si, MD.  CHIEF COMPLAINT:  Shortness of breath.  SUBJECTIVE:  This is a 77 year old female resident of [facility] with a history of stage III endometrial cancer, now in remission, history of right-sided breast cancer, and non-small cell lung cancer, who presents to the ED with a chief complaint of shortness of breath. The shortness of breath started around 4 p.m. yesterday evening and lasted for about 1 hour.  The patient's shortness of breath was primarily nonexertional, not associated with any chest pain.  She denied any fever, chills, rigors, or cough.  She also denies any history of orthopnea, paroxysmal nocturnal dyspnea, or dependent edema.  She states that she felt that her heart was racing during this episode.  The patient was transported to ED via EMS.  When the EMS evaluated the patient, she was found to be in atrial fibrillation with a rate of 100 beats per minute and she was otherwise found to be hemodynamically stable at that point.  She received 10 mg of IV Cardizem followed by 5 mg per hour drip and subsequently converted to normal sinus rhythm.  CT angio was also done by the ER that ruled out any underlying pulmonary embolism or pneumonia.  She is being admitted for her new onset atrial fibrillation.  PAST MEDICAL HISTORY:  History of bilateral breast cancer, diagnosed 12 years ago; new invasive lobular carcinoma of the right breast, diagnosed in August of 2010; stage IIIB non-small cell lung cancer, diagnosed in August 2008; stage IIIC endometrial cancer, diagnosed in August  2005; obsessive-compulsive disorder; anxiety disorder; peripheral neuropathy.  PAST SURGICAL HISTORY:  Appendectomy, tonsillectomy, adenoidectomy, bilateral breast lumpectomy, total abdominal hysterectomy, bilateral salpingo-oophorectomy, pelvic and periaortic lymphadenectomy, video bronchoscopy, left scalene lymph node biopsy.  SOCIAL HISTORY:  She is a nonsmoker, lives alone, and she lives in an assisted-living.  The patient smoked for about 20 years and quit 18 years ago.  FAMILY HISTORY:  Negative for gynecologic breast cancer or colon cancer  REVIEW OF SYSTEMS:  Complete review of systems was done as documented in HPI.  PHYSICAL EXAMINATION:  VITAL SIGNS:  Blood pressure 122/73, pulse 74, respirations 17, and 96% on room air. GENERAL:  The patient is currently alert, oriented, comfortable, in no acute cardiopulmonary distress. HEENT:  Pupils equal and reactive.  Extraocular movements intact. NECK:  Supple.  No JVD. LUNGS:  Occasional rhonchi at the bases. CARDIOVASCULAR:  Regular rate and rhythm.  No murmurs, rubs, or gallops appreciated at this time. ABDOMEN:  Soft, nontender, nondistended. EXTREMITIES:  Without any cyanosis, clubbing, or edema. NEUROLOGIC:  Cranial nerves II-XII grossly nonfocal.  Motor strength intact in bilateral upper and lower extremity. PSYCHIATRIC:  Appropriate  mood and affect without any evidence of any anxiety.  ALLERGIES:  No known drug allergies.  HOME MEDICATIONS:  Aspirin, Aleve, calcium, metoprolol, Ocuvite, anastrozole,       gabapentin, glycopyrrolate, Prozac, Luvox, Compazine.  LABS:  CT angio of the chest shows no evidence of PE, bibasilar atelectasis, with mild prominence of the interstitium without any evidence of edema, trace borderline prominent pericardial effusion, tiny hiatal hernia, contiguous anterior bridging, osteophytes along with thoracic spine combative with DISH.  Chest x-ray two-view shows findings compatible with  COPD.  Sodium 140, potassium 3.5, chloride 105, glucose 112, BUN 13, creatinine 0.78, calcium 9.3.  CBC:  WBC 6.1, hemoglobin 16.3, hematocrit 47.5, and platelet count of 150.  Urinalysis, specific gravity 1.017, pH of 8.0.  ASSESSMENT AND PLAN: 1. New onset atrial fibrillation, now converted to normal sinus rhythm     with Cardizem. 2. History of bilateral breast cancer, lung cancer, and endometrial     cancer. 3. Hypertension. 4. Probably borderline chronic obstructive pulmonary disease. 5. Probably early chronic obstructive pulmonary disease.  The patient is already on a beta-blocker at home since she converted with Cardizem.  We will continue diltiazem at 60 mg p.o. q.8 h., which may be changed to a long-acting Cardizem CD tablet at the time of discharge.  The patient's CHADS2 score is about 2.  RECOMMENDATIONS:  The patient will be started on full-dose anticoagulation and her Lovenox may be discontinued when her INR is greater than 2.  She does not report any risk factors of upper or lower GI bleeding at this time.  She may be a high fall risk given the advanced age, however, she is in a assisted living facility and does not report any history of falls.  The patient is definitely symptomatic when she is in atrial fibrillation, therefore rhythm control would be the primary goal in her treatment.  We will check a thyroid function test.  We will obtain a 2-D echo.  There is no evidence of pulmonary embolism on her CT angio.  The patient has a history of underlying anxiety, however, she does not appear to be anxious at this time.  The patient's power of attorney is [name] and [name], whom happen to be the patient's brothers.  She also has a son called, [name], who is adopted. The patient is a full code.     Kellie Overlie, MD     NA/MEDQ  D:  01/21/2011  T:  01/21/2011  Job:  [id number]  Electronically Signed by Kellie Overlie MD on 01/25/2011  12:40:46 PM

## 2011-01-29 NOTE — Discharge Summary (Signed)
[name], [name]                 ACCOUNT NO.:  [id number]  MEDICAL RECORD NO.:  [id number]           PATIENT TYPE:  I  LOCATION:  2040                         FACILITY:  [facility]  PHYSICIAN:  [name], M.D.       DATE OF BIRTH:  [birth year]  DATE OF ADMISSION:  01/20/2011 DATE OF DISCHARGE:  01/24/2011                        DISCHARGE SUMMARY - REFERRING   PRIMARY CARE PHYSICIAN:  Kellie Curt. Chilton Si, MD  DISCHARGE DIAGNOSES: 1. Urinary tract infection. 2. New onset atrial fibrillation with rapid ventricular response. 3. Fecal impaction and constipation. 4. Status post lung cancer. 5. Status post breast cancer. 6. Status post uterine cancer, stage IIIB. 7. All the above cancers are in remission without evidence of     recurrence. 8. Severe weakness and deconditioning. 9. Obsessive-compulsive disorder. 10.Peripheral neuropathy. 11.Status appendectomy. 12.Status post hysterectomy. 13.Status post bilateral breast lumpectomies. 14.Hypomagnesemia. 15.Hypokalemia. 16.Mild elevated transaminases - need outpatient followup. 17.Chronic cerebrovascular disease, bridging osteophytes along the     thoracic spine.  DISCHARGE MEDICATIONS: 1. Cefuroxime 500 mg by mouth twice a day. 2. Tylenol 650 mg by mouth every 4 hours as needed for pain. 3. Diltiazem 120 mg by mouth daily. 4. Magnesium oxide 400 mg daily. 5. Rivaroxaban 20 mg daily. 6. Metoprolol 25 mg daily. 7. Luvox 1 tablet daily. 8. Aspirin 81 mg daily. 9. Ocuvite 1 tablet daily. 10.Neurontin 200 mg 3 times a day. 11.Compazine 10 mg every 6 hours as needed for nausea. 12.Naproxen 220 mg by mouth 3 times a day as needed for pain. 13.Prozac 20 mg daily. 14.Robinul 2 mg twice a day. 15.Anastrozole 1 mg daily. 16.Calcium with vitamin D 1 tablet by mouth twice a day.  CONDITION ON DISCHARGE:  [name] is going to be transferred to skilled nursing home for short-term rehabilitation.  PROCEDURE THIS ADMISSION: 1. The  patient underwent a CT angiogram of the chest which was     negative for pulmonary emboli, negative for recurrent malignancy. 2. Head CT without contrast positive for atrophy and chronic     microvascular ischemia. 3. January 23, 2011, abdominal x-ray positive for fecal impaction and     stool in the rectum and left sigmoid colon. 4. Transthoracic echocardiogram showing an ejection fraction of 60-65%     and no regional wall motion abnormalities.  HISTORY AND PHYSICAL:  Refer to detailed H and P done by Dr. Susie Cassette.  HOSPITAL COURSE:  [name] is a 77 year old woman with history of 3 malignancies, all them in remission who was sent over from her assisted living with some shortness of breath and increased weakness.  She was found to be in atrial fibrillation with rapid ventricular response.  She was placed on a Cardizem drip and she converted spontaneously to normal sinus rhythm.  She was then transitioned to Cardizem 120 mg daily for pain relief.  She is currently anticoagulated with Xarelto. 1. Urinary tract infection.  A urine culture was sent out and the     patient was started on IV Rocephin. 2. Hypokalemia and hypomagnesemia.  This has been repleted throughout     this admission. 3. Episode  of urinary retention on January 23, 2011.  The patient     developed urinary retention.  We obtained abdominal x-ray which     proved presence of fecal impaction.  The patient received enema to     relieve the fecal impaction and to help with urinary retention. 4. Weakness and deconditioning, peripheral neuropathy status post 3     malignancies.  The patient evaluated by physical therapist and     occupational therapist and felt to be a candidate for short-term     rehabilitation.  This has been in process and we expect the patient     will be transferred to skilled nursing home tomorrow on January 24, 2011.     [name], M.D.     SL/MEDQ  D:  01/23/2011  T:  01/23/2011  Job:   [id number]  cc:   [name], M.D.  Electronically Signed by [name] M.D. on 01/28/2011 09:53:19 AM

## 2011-01-30 NOTE — Discharge Summary (Signed)
  NAMEBRELEIGH, CARPINO                 ACCOUNT NO.:  [id number]  MEDICAL RECORD NO.:  [id number]           PATIENT TYPE:  I  LOCATION:  2040                         FACILITY:  [facility]  PHYSICIAN:  Jeoffrey Massed, MD    DATE OF BIRTH:  03/26/1930  DATE OF ADMISSION:  01/20/2011 DATE OF DISCHARGE:                        DISCHARGE SUMMARY - REFERRING   ADDENDUM  PRIMARY CARE PRACTITIONER:  Lenon Curt. Chilton Si, MD  further details regarding the patient's hospital course, discharge diagnoses, discharge medications, please see the discharge summary that was dictated by Dr. Lavera Guise yesterday.  Overnight, there have been no new issues.  The patient continues to be in sinus rhythm.  Current plans are to transfer the patient to a skilled nursing facility for short-term rehab, there is a bed available today.  The patient is currently anticoagulated with Xarelto and is being rate controlled with Cardizem and metoprolol.  At this point, the patient will be discharged and the above-noted medications and our recommendation is that the patient have an outpatient followup with a cardiologist.  We will defer this to her primary care practitioner.  Please also note a 2-D echocardiogram done this admission shows an EF around 60-65% with mild dilatation of the left atrium.  DISPOSITION:  The patient to be transferred to a skilled nursing facility.  FOLLOWUP INSTRUCTIONS: 1. Follow up with primary care practitioner at the skilled nursing     facility. 2. Suggest outpatient cardiology referral for atrial fibrillation.  The total time spent coordinating discharge is 45 minutes.     Jeoffrey Massed, MD     SG/MEDQ  D:  01/24/2011  T:  01/24/2011  Job:  [id number]  cc:   Lenon Curt. Chilton Si, M.D.  Electronically Signed by Jeoffrey Massed  on 01/30/2011 08:05:52 PM

## 2011-02-27 NOTE — Op Note (Signed)
[name], [name]                 ACCOUNT NO.:  [id number]   MEDICAL RECORD NO.:  [id number]          PATIENT TYPE:  AMB   LOCATION:  SDS                          FACILITY:  [facility]   PHYSICIAN:  Ines Bloomer, M.D. DATE OF BIRTH:  Nov 09, 1929   DATE OF PROCEDURE:  DATE OF DISCHARGE:  08/15/2007                               OPERATIVE REPORT   PREOPERATIVE DIAGNOSIS:  Left upper lobe mass.   POSTOPERATIVE DIAGNOSIS:  Non-small-cell cancer left upper lobe clinical  stage IIIB.   SURGEON:  Ines Bloomer, M.D.   ANESTHESIA:  General anesthesia.   PROCEDURE IN DETAIL:  After adequate general anesthesia fiberoptic  bronchoscope was passed through the endotracheal tube.  Carina was in  the midline.  The right upper lobe, right middle lobe and right lower  lobe orifices were normal.  Left mainstem showed eccentric narrowing  distally and there is both eccentric narrowing of the left upper lobe  and left lower lobe orifices with mucosal edema.  Pictures were taken  and biopsies of the left upper lobe were done with brushings as well as  a Wang needle in the subcarinal area.  The video bronchoscope was  removed.  The anterior neck was prepped and draped in the usual sterile  manner.  A transverse incision was made over the left scalene and  dissection was carried down splitting the sternocleidomastoid and  inserting a small Weitlaner retractor.  The left internal jugular vein  was reflected medially exposing a large scalene node which was biopsied  and sent for frozen section and was non-small-cell lung cancer.  All  bleeding was electrocoagulated.  The muscle was closed with 2-0 Vicryl  and the subcutaneous tissue with 3-0 Vicryl and Dermabond for the skin.  The patient was returned to the recovery room in stable condition.      Ines Bloomer, M.D.  Electronically Signed     DPB/MEDQ  D:  08/15/2007  T:  08/16/2007  Job:  [id number]

## 2011-02-27 NOTE — Consult Note (Signed)
NAME:  [name], [name]                 ACCOUNT NO.:  [id number]   MEDICAL RECORD NO.:  [id number]          PATIENT TYPE:  OUT   LOCATION:  GYN                          FACILITY:  [facility]   PHYSICIAN:  De Blanch, M.D.DATE OF BIRTH:  11-05-1929   DATE OF CONSULTATION:  DATE OF DISCHARGE:                                 CONSULTATION   GYNECOLOGY/ONCOLOGY CLINIC:   CHIEF COMPLAINT:  Endometrial cancer.   INTERVAL HISTORY:  Since her last visit, the patient has subsequently  developed a stage IIIB (T3 N3 MX) non-small cell lung cancer (poorly  differentiated carcinoma with focal squamous differentiation diagnosed  in August of 2008).  She has been receiving chemotherapy with  carboplatin and Taxol and apparently is having a nice response based on  serial CT scans.  She denies any pulmonary symptoms.  From a gynecologic  point of view, she denies any GI or GU symptoms, has no pelvic pain,  pressure, vaginal bleeding or discharge.   HISTORY OF PRESENT ILLNESS:  The patient has a stage IIIC carcinoma of  the endometrium with multiple pelvic and periaortic lymph node  metastases.  Her initial surgery was in June of 2005.  Postoperatively,  she received postoperative extended field radiation therapy.   PAST MEDICAL HISTORY:  1. Bilateral breast cancer.  2. Lung cancer.  3. Hypertension.  4. Anxiety disorder.  5. Obsessive-compulsive disorder.   CURRENT MEDICATIONS:  1. Darvocet.  2. Lorazepam.  3. Toprol-XL.  4. Baby aspirin.   PAST SURGICAL HISTORY:  1. Appendectomy.  2. Tonsil and adenoidectomy.  3. Bilateral cataract removal.  4. Bilateral breast lumpectomies.  5. TAH/BSO.  6. Pelvic and periaortic lymphadenectomy.  7. Video bronchoscopy.  8. Left scaly node excision biopsy.   FAMILY HISTORY:  Negative for having gynecologic, breast or colon  cancer.   OBSTETRICAL HISTORY:  Gravida 2.   SOCIAL HISTORY:  The patient is a nonsmoker.  She lives alone.  Is  retired.   REVIEW OF SYSTEMS:  A 10-point comprehensive review of systems is  negative except as noted above.  The patient is happier that her  alopecia is recovering now that she is not taking chemotherapy any  further.   PHYSICAL EXAMINATION:  GENERAL:  The patient is a healthy white female  in no acute distress.  HEENT:  Negative.  NECK:  Supple without thyromegaly.  There is no supraclavicular or  inguinal adenopathy.  ABDOMEN:  Soft and nontender.  No masses, organomegaly, ascites or  hernias are noted.  PELVIC:  EG/BUS, vagina, bladder and urethra are normal.  Cervix and  uterus are surgically absent.  Adnexa without masses.  RECTOVAGINAL:  Confirms.  EXTREMITIES:  Lower extremities without edema or varicosities.   IMPRESSION:  Stage IIIC endometrial carcinoma.  No evidence of recurrent  disease.   PLAN:  Pap smears were obtained.  The patient will return to see me in 6  months.  She will continue under the care of Dr. Si Gaul for her  lung cancer.      De Blanch, M.D.  Electronically Signed  DC/MEDQ  D:  06/08/2008  T:  06/08/2008  Job:  [id number]   cc:   Lajuana Matte, MD  Fax: [phone]   Lenon Curt. Chilton Si, M.D.  Fax: [phone]   Telford Nab, R.N.  [address]  [place], [place] [address]

## 2011-02-27 NOTE — Assessment & Plan Note (Signed)
OFFICE VISIT   PUNEET, SELDEN  DOB:  23-Sep-1930                                        August 28, 2007  CHART #:  [id number]   Ms. Rettke came for follow-up today and her scaly node biopsy has a  small eschar on it but otherwise is healing well.  She starts  chemotherapy next week. She has been stable otherwise.  Her blood  pressure is 130/79, pulse 100, respirations 18, sats were 95%.  I will  see her back again in 3 weeks for final check.   Ines Bloomer, M.D.  Electronically Signed   DPB/MEDQ  D:  08/28/2007  T:  08/29/2007  Job:  [id number]

## 2011-02-27 NOTE — Discharge Summary (Signed)
NAMELIBORIA, PUTNAM                 ACCOUNT NO.:  [id number]   MEDICAL RECORD NO.:  [id number]          PATIENT TYPE:  INP   LOCATION:  1336                         FACILITY:  [facility]   PHYSICIAN:  Lajuana Matte, MD  DATE OF BIRTH:  June 25, 1930   DATE OF ADMISSION:  11/17/2007  DATE OF DISCHARGE:  11/20/2007                               DISCHARGE SUMMARY   DISCHARGE DIAGNOSES:  1. Right upper extremity cellulitis.  2. Neutropenia.  3. Anemia.  4. Stage IIIB non-small-cell lung cancer.  5. Anxiety.   HISTORY:  [name] is a 77 year old, white female diagnosed with  stage IIIB (T2-N3-MN) non-small-cell lung cancer, poorly differentiated  carcinoma with focal squamous differentiation in October of 2008.  She  is status post four cycles of systemic chemotherapy with carboplatin for  an AUC of 6, and paclitaxel at 200 mg/m2 given every three weeks with  Neulasta support, most recently given November 10, 2007.  The patient had  extravasation of the paclitaxel into the right forearm during the most  recent cycle of chemotherapy with subsequent forearm erythema and  blister formation.  On the day of discharge, she presented with the  entire right arm being erythematous and warm with 2+ soft tissue edema  and associated pain.  She also had her weekly blood counts checked and  was found to be severely neutropenic with a total white count of 0.7 and  an ANC of 0.0.   HOSPITAL COURSE:  The patient was admitted to the Oncology service for  further management.  She was supported with IV fluids, and blood  cultures were obtained.  She was placed on IV Zosyn for antibiotic  coverage for the cellulitis.  For the neutropenia, she was treated with  Neupogen 300 mcg subcutaneously daily until her counts recovered.  For  anemia with a hemoglobin of 8.3, she was transfused two units of packed  red blood cells with resolution and stabilization.  The right upper  extremity soft tissue swelling  decreased somewhat throughout the  admission; however, the arm remained somewhat erythematous.  For her  hypertension, she was continued on her home medications.  For anxiety,  she was continued on her Ativan at 1 mg p.o. q.8h.  For GI prophylaxis,  she was placed on Protonix 40 mg p.o. daily.  For DVT prophylaxis, she  was treated with Lovenox 40 mg subcutaneously on a daily basis.  By day  of discharge, the patient's white count and hemoglobin had recovered.  She was maintained on her other home medications as listed on her  medication list.   DISCHARGE CONDITION:  Good.   DISCHARGE DIET:  No restrictions.   DISCHARGE ACTIVITY:  Increase activity slowly.   DISCHARGE MEDICATIONS:  1. Augmentin 875 mg p.o. b.i.d. for seven days.  2. Anafranil 25 mg p.o. b.i.d.  3. Protonix 40 mg p.o. daily.  4. Magic Mouthwash 5 ml swish and spit q.6h.  5. Toprol-XL 25 mg p.o. q.h.s.  6. Requip 2 mg p.o. q.h.s.  7. Oxycodone 5 mg p.o. q.4h p.r.n. pain.  8. Ativan 1 mg  p.o. q.8h for anxiety.  9. Albuterol inhaler one to two puffs p.o. q.6h p.r.n. wheezing.  10.Compazine 10 mg p.o. q.6h p.r.n. nausea or vomiting.   DISCHARGE FOLLOWUP:  The patient is to follow up with Dr. June Leap.  [name] on November 25, 2007, with a lab appointment at 11:30 a.m. and  [name] appointment at 12 o'clock.      Kellie Loft, PA.      Lajuana Matte, MD  Electronically Signed    AJ/MEDQ  D:  11/20/2007  T:  11/20/2007  Job:  [id number]

## 2011-02-27 NOTE — H&P (Signed)
[name], [name]                 ACCOUNT NO.:  [id number]   MEDICAL RECORD NO.:  [id number]          PATIENT TYPE:  INP   LOCATION:  1336                         FACILITY:  [facility]   PHYSICIAN:  Lajuana Matte, MD  DATE OF BIRTH:  04/20/1930   DATE OF ADMISSION:  11/17/2007  DATE OF DISCHARGE:                              HISTORY & PHYSICAL   REASON FOR ADMISSION:  1. Cellulitis.  2. Neutropenia.  3. Non-small-cell lung cancer.   HISTORY:  [name] is a 77 year old white female diagnosed with stage  IIIB (T2, N3, MX), non-small-cell lung cancer, poorly differentiated  carcinoma with focal squamous differentiation in October 2008.  She is  status post four cycles of systemic chemotherapy with carboplatin 6 and  paclitaxel 200 mg per sq/m, given every three weeks with Neulasta  support.  This was most recently given November 10, 2007.  The patient  had extravasation of the paclitaxel into the right forearm during the  most recent cycle of chemotherapy with subsequent forearm erythema and  blister formation.  These areas resolved to some extent; however, on the  day of admission she presents with entire right arm erythema, warmth  with 2+ soft tissue edema and associated pain.  There is also some  erythematous streaking noted up and down the arm.  She presented for her  weekly blood counts and also to have further close followup of her right  arm, when she was found to be severely neutropenic with a total white  count of 0.7 and AMC of 0.  She has had some subjective chills but no  frank fever.   REVIEW OF SYSTEMS:  She denied specific fever.  She had a T-Max of 100  degrees on one occasion and took two Tylenol with resolution and has had  subjective chills.  She denied headache, blurred vision or double  vision.  She has had no chest pain, shortness of breath, cough,  hemoptysis, palpitations or syncopal episodes.  She denied nausea and  vomiting and abdominal pain, diarrhea,  constipation, melena or  hematochezia.  She denied dysuria, hematuria, urgency or increased  frequency.  Her main complaint is right upper extremity pain and  swelling.   PAST MEDICAL HISTORY:  1. Significant for hypertension.  2. History of bilateral breast carcinoma diagnosed eight and nine      years ago, status post lumpectomies, followed by radiotherapy and      Tamoxifen for five years.  3. History of stage III endometrial carcinoma, diagnosed in June 2005,      status post total abdominal hysterectomy with bilateral salpingo-      oophorectomy.  4. History of anxiety disorder.  5. History of obsessive/compulsive disorder.  6. Status post appendectomy.  7. Status post bilateral cataract removal.  She denied any history of coronary artery disease, stroke or diabetes  mellitus.   FAMILY HISTORY:  Her mother died at age 85.  Father died in a motor  vehicle accident.  There is no family history of malignancy.   SOCIAL HISTORY:  She is divorced, with two adopted children,  who are  both grown.  She is retired, having worked in several jobs, including in  the Production manager.  Tobacco use:  The patient smoked one pack of  cigarettes daily for approximately 40 years, but quit around 18 years  ago.  She drinks a glass of red wine daily.  Denied any history of drug  abuse.   ALLERGIES:  No known drug allergies.   HOME MEDICATIONS:  1. Aspirin 81 mg p.o. daily.  2. Ativan 1 mg p.o. three times daily p.r.n. anxiety.  3. Compazine 10 mg p.o. q.6h. p.r.n. nausea.  4. Requip 1 mg p.o. q.h.s.  5. Proventil meter dose inhaler, two puffs q.6h. p.r.n. wheezing.  6. Toprol XL 25 mg p.o. daily.  7. Caltrate 1 mg twice daily.  8. Omega-3, 1000 mg daily.  9. Anafranil 25 mg twice daily.   PHYSICAL EXAMINATION:  VITAL SIGNS:  Blood pressure 111/70, pulse 117,  temperature 98 degrees.  GENERAL:  A pleasant 77 year old white female, awake, alert, in no acute  distress.  HEENT:  Normocephalic  and atraumatic.  Oropharynx revealed some  ulcerations in the buccal mucosa and soft palate.  NECK:  Supple without lymphadenopathy.  CHEST:  Clear to auscultation without wheezes, rales or rhonchi.  CARDIOVASCULAR:  A tachycardic rate with a normal rhythm.  Normal S1 and  S2.  No appreciable murmurs, gallops or rubs.  ABDOMEN:  Soft, nontender, nondistended, without masses.  EXTREMITIES:  Lower extremities without edema.  Upper extremities:  Left  upper extremity is without edema or lesions of any kind.  The right  upper extremity is erythematous from the tips of the fingers up to the  shoulder.  In the medial aspect of the right forearm there are some  healing bullous- type lesions.  The entire upper extremity is  erythematous and warm with 2+ soft tissue edema.   IMPRESSION/PLAN:  This is a pleasant 77 year old white female,  presenting with right upper extremity cellulitis and severe neutropenia,  in the setting of stage IIIB non-small-cell lung cancer:  She will be  admitted to the oncology service and placed on IV antibiotics,  specifically Zosyn.  We will continue her home medications, with the  exception of her aspirin and Advil.  For pain management, she will be  given Oxy IR 5 mg, one to two tab p.o. q.4-6h.  She will be supported  with IV fluids, consisting of normal saline for the neutropenia.  She  will be started on Neupogen at 300 mcg subcutaneously on a daily basis  for deep venous thrombosis prophylaxis.  She will be placed on Lovenox  40 mg subcutaneously on a daily basis for GI prophylaxis and Protonix 40  mg p.o. daily.      Kellie Loft, PA.      Lajuana Matte, MD  Electronically Signed    AJ/MEDQ  D:  11/17/2007  T:  11/17/2007  Job:  [id number]

## 2011-02-27 NOTE — Consult Note (Signed)
[name], [name]                 ACCOUNT NO.:  [id number]   MEDICAL RECORD NO.:  [id number]          PATIENT TYPE:  OUT   LOCATION:  GYN                          FACILITY:  [facility]   PHYSICIAN:  De Blanch, M.D.DATE OF BIRTH:  May 30, 1930   DATE OF CONSULTATION:  04/22/2007  DATE OF DISCHARGE:  04/22/2007                                 CONSULTATION   CHIEF COMPLAINT:  Endometrial cancer.   INTERVAL HISTORY:  Since her last visit, the patient has done well.  She  denies any GI or GU symptoms.  Has no pelvic pain, pressure, vaginal  bleeding, or discharge.  Functional status has been excellent.   HISTORY OF PRESENT ILLNESS:  Stage IIIc endometrial carcinoma undergoing  initial surgery June 2005.  She had extensive resection and multiple  positive pelvic and periaortic lymph nodes.  She received extended-field  radiation therapy postoperatively.  She has been followed since then  with no evidence of recurrent disease.   PAST MEDICAL HISTORY:  Medical illnesses:  Bilateral breast cancer,  hypertension, anxiety disorder, obsessive-compulsive disorder.   Current medications:  Darvocet, lorazepam, Toprol-XL, baby aspirin.   Past surgical history:  Appendectomy, tonsils and adenoidectomy,  bilateral cataract removal, ear surgery, bilateral breast lumpectomies,  TAH/BSO, pelvic and periaortic lymphadenectomy 2005.   DRUG ALLERGIES:  None.   FAMILY HISTORY:  Negative for gynecologic, breast or colon cancer.   OBSTETRICAL HISTORY:  Gravida 2.   SOCIAL HISTORY:  The patient does not smoke.  She lives alone and is  retired.   REVIEW OF SYSTEMS:  A 10-point comprehensive review of systems negative  except as noted above.   PHYSICAL EXAMINATION:  Weight 150 pounds, blood pressure 110/70, pulse  80, respiratory rate 18.  GENERAL:  The patient is a patient healthy white female in no acute  distress.  HEENT:  Negative.  NECK:  Supple without thyromegaly.  There is no  supraclavicular or  inguinal adenopathy.  ABDOMEN:  Soft, nontender.  No mass, organomegaly or ascites are noted.  There are no hernias.  PELVIC:  EG/BUS, vagina, bladder, urethra are normal but atrophic.  No  lesions are noted.  Pap smears are obtained.  Bimanual and rectovaginal  exam reveal no masses, induration or nodularity.  Cervix and uterus are  surgically absent.   IMPRESSION:  Stage IIIc endometrial carcinoma, no evidence of recurrent  disease.   PLAN:  Pap smears are obtained today.  The patient will return to see me  in 6 months for continuing followup.      De Blanch, M.D.  Electronically Signed     DC/MEDQ  D:  04/24/2007  T:  04/24/2007  Job:  [id number]   cc:   Telford Nab, R.N.  [address]  [place], [place] [address]   Rudy Jew. Ashley Royalty, M.D.  Fax: [phone]   Madilyn Fireman, PA   Lenon Curt. Chilton Si, M.D.  Fax: [phone]

## 2011-02-27 NOTE — Letter (Signed)
August 07, 2007   Lenon Curt. Chilton Si, M.D.  [address]  [place], [place] [address]   Re:  Kellie, Warren                 DOB:  30-Jun-1930   Dear Art:   I appreciate the opportunity seeing [name].  This 77 year old  patient had some excessive sputum, got a chest x-ray and then a CT scan.  The CT scan showed a left hilar mass that is 3.5 x 3.6 in diameter in  the left upper lobe with involvement of probably the left upper lobe  bronchus and the left pulmonary artery with aortopulmonary window and  mediastinal adenopathy.  She is referred here for evaluation.  We did  pulmonary function tests, showed an FVC of 2.50 with an FEV1 of only 23%  or 6.5.  However, in questioning her, she is much more active and  exercises a lot, so I think her pulmonary function tests are probably a  little low.  Plan to get a full set of pulmonary function tests on her.  She has had no hemoptysis, fevers, chills, excessive sputum.   Her medications include:  1.  Lorazepam 1 mg three times a day.  2.  ReQuip 1 mg at night.  3.  __________  25 mg twice a day.  4.  Symbyax 3 mg.  5.  Toprol-XL 100 mg one-fourth of a tablet daily.   PAST MEDICAL HISTORY:  Significant for hypertension.   FAMILY HISTORY:  Unremarkable.   SOCIAL HISTORY:  She is single, has two children.  She quit smoking 16  years ago and does not drink alcohol on a regular basis, drinks a glass  of red wine daily.   REVIEW OF SYSTEMS:  She is 156 and three-fourths pounds.  CARDIAC:  No  angina or atrial fibrillation.  PULMONARY:  See history of  present  illness.  GI:  No nausea, vomiting, constipation or diarrhea.  GU:  No  dysuria or frequent urination.  VASCULAR:  She apparently has restless  leg syndrome.  No claudication, DVT, TIAs.  NEUROLOGIC:  She has some  headaches.  No dizziness, blackout or seizures.  MUSCULOSKELETAL:  Some  arthritis.  No psychiatric illnesses.  EYE/ENT:  No change in her  eyesight or hearing.   HEMATOLOGIC:  No problems with bleeding or  clotting disorders.   PHYSICAL EXAMINATION:  She is a well-developed Caucasian female in no  acute distress.  Her blood pressure is 139/87, pulse 100, respirations  18, saturations were 95%.  HEENT:  Unremarkable.  NECK:  Supple without thyromegaly.  There is no supraclavicular or  axillary adenopathy.  CHEST:  Clear to auscultation and percussion.  HEART:  Regular sinus rhythm, no murmurs.  ABDOMEN:  Soft.  There is no hepatosplenomegaly.  Pulses are 2+.  There  is no clubbing or edema.  NEUROLOGIC:  She is oriented x3.  Sensory and motor are intact.   When I informed [name] that she probably had a lung cancer, she  seemed quite surprised.  I do not know if she is in a state of denial or  not, but I went over in detail, explaining what the findings of the x-  rays were and that she needed to have a bronchoscopy, mediastinoscopy,  PET scan, brain scan, and a full set of pulmonary function tests.  We  have scheduled these for next week and I have tentatively scheduled her  mediastinoscopy for  a week from today, or August 13, 2007.   I appreciate the opportunity to see her.  I will let you know what our  findings are if she decides to let us go ahead and let us do all these  tests.  She seems somewhat reluctant at present, but again, we stressed  to her that this was a test that needed to be done.   Ines Bloomer, M.D.  Electronically Signed   DPB/MEDQ  D:  08/07/2007  T:  08/08/2007  Job:  [id number]

## 2011-02-27 NOTE — Assessment & Plan Note (Signed)
OFFICE VISIT   KYNZIE, POLGAR  DOB:  1930/04/20                                        September 18, 2007  CHART #:  [id number]   Her blood pressure is 118/75, pulse 100, respirations 20, sats are 96%.  Her left scalene node biopsy was finally healing well.  She is doing  well overall.  She has intercurrent chemotherapy and I will see her  again if there are future problems.   Ines Bloomer, M.D.  Electronically Signed   DPB/MEDQ  D:  09/18/2007  T:  09/19/2007  Job:  [id number]

## 2011-02-27 NOTE — Consult Note (Signed)
[name], [name]                 ACCOUNT NO.:  [id number]   MEDICAL RECORD NO.:  [id number]          PATIENT TYPE:  OUT   LOCATION:  GYN                          FACILITY:  [facility]   PHYSICIAN:  De Blanch, M.D.DATE OF BIRTH:  03-17-1930   DATE OF CONSULTATION:  11/25/2007  DATE OF DISCHARGE:  11/25/2007                                 CONSULTATION   CHIEF COMPLAINT:  Endometrial cancer.   INTERVAL HISTORY:  Since her last visit, the patient has had the  misfortune of developing a stage III non-small-cell lung cancer which is  poorly differentiated.  She is currently receiving chemotherapy with  carboplatin and Taxol.  She recently had an infiltration in an IV site  in her right arm and is recovering from this with minimal skin necrosis,  although there is a considerable amount of edema and erythema in the arm  still.   HISTORY OF PRESENT ILLNESS:  Stage IIIC endometrial carcinoma.  The  patient underwent initial surgery in June 2005 with extensive pelvic and  periaortic lymphadenectomy.  She received postoperative extended field  radiation therapy.   PAST MEDICAL HISTORY:  1. Bilateral breast cancer.  2. Lung cancer.  3. Hypertension.  4. Anxiety disorder.  5. Obsessive-compulsive disorder.   CURRENT MEDICATIONS:  Darvocet, lorazepam, Toprol XL baby aspirin.   PAST SURGICAL HISTORY:  1. Appendectomy.  2. Tonsil and adenoidectomy.  3. Bilateral cataract removal.  4. Ear surgery.  5. Bilateral breast lumpectomies.  6. TAH-BSO.  7. Pelvic and periaortic lymphadenectomy in 2005.  8. Video bronchoscopy.  9. Left scalene node excisional biopsy.   FAMILY HISTORY:  Negative gynecologic, breast or colon cancer.   OBSTETRICAL HISTORY:  Gravida 2.   SOCIAL HISTORY:  The patient does not smoke.  She lives at home, is  retired.   REVIEW OF SYSTEMS:  A 10-point comprehensive review of systems is  negative except as noted above.   PHYSICAL EXAMINATION:  GENERAL  APPEARANCE:  A healthy white female in no  acute distress.  HEENT:  Negative.  NECK:  Supple without thyromegaly.  SKIN:  She does have a considerable edema and erythema and some skin  necrosis on her right upper extremity.  ABDOMEN:  Soft, nontender.  No mass, organomegaly, ascites or hernias  noted.  PELVIC:  EG, BUS, vagina, bladder and urethra are normal.  Cervix and  uterus surgically absent.  Adnexa without masses.  Rectovaginal exam  confirms.  LOWER EXTREMITIES:  Without edema or varicosities.   IMPRESSION:  Stage IIIC endometrial cancer.  No evidence of recurrent  disease.   PLAN:  Pap smears were obtained.  The patient will return to see me in  six months for continuing follow-up.      De Blanch, M.D.  Electronically Signed     DC/MEDQ  D:  11/25/2007  T:  11/27/2007  Job:  [id number]   cc:   Telford Nab, R.N.  [address]  [place], [place] [address]   Rudy Jew. Ashley Royalty, M.D.  Fax: [phone]   Madilyn Fireman, M.D.   Lenon Curt Chilton Si, M.D.  Fax: [phone]   Lajuana Matte, MD  Fax: [phone]

## 2011-03-02 NOTE — Consult Note (Signed)
NAME:  [name], [name]                           ACCOUNT NO.:  [id number]   MEDICAL RECORD NO.:  [id number]                   PATIENT TYPE:  OUT   LOCATION:  GYN                                  FACILITY:  [facility]   PHYSICIAN:  De Blanch, M.D.         DATE OF BIRTH:  1930/09/26   DATE OF CONSULTATION:  DATE OF DISCHARGE:                                   CONSULTATION   A 77 year old white female referred by Dr. Sylvester Harder for consultation  regarding management of a newly diagnosed adenocarcinoma of the endometrium.  The patient was entirely asymptomatic but on ultrasound and CT scan was  found to have an endometrial mass.  She subsequently underwent hysteroscopy  and D&C by Dr. Ashley Royalty, revealing an adenocarcinoma with squamous  differentiation and scattered foci of spindle cells and scattered mytotic  figures, which raise the possibility of a focal sarcomatous component.  The  patient has no other significant gynecologic history.   PAST MEDICAL HISTORY:  1. Bilateral breast cancer, 8 and 7 years ago. Treated with wide local     excision, radiation therapy, and five years of Tamoxifen.  She is     currently not receiving any therapy.  2. Hypertension.  3. Anxiety disorder.  4. Obsessive-compulsive disease.   CURRENT MEDICATIONS:  Darvocet.  Lorazepam.  Fluvoxamine.  Toprol.   PAST SURGICAL HISTORY:  1. Appendectomy.  2. Tonsillectomy and adenoidectomy.  3. Bilateral cataract removal.  4. Ear surgery.  5. Bilateral breast lumpectomies.   DRUG ALLERGIES:  None.   FAMILY HISTORY:  Negative for gynecologic, breast, or colon cancer.   OBSTETRICAL HISTORY:  Gravida 2.   SOCIAL HISTORY:  The patient does not smoke.  She lives alone.  She drinks  approximately two glasses of wine daily.   REVIEW OF SYSTEMS:  Negative except as noted above.   PHYSICAL EXAMINATION:  VITAL SIGNS:  Height 5 foot 6.  Weight 138 pounds.  Blood pressure 108/70, pulse 60, respiratory  rate 18.  GENERAL:  The patient is a healthy white female in no acute distress.  HEENT:  Negative.  NECK:  Supple without thyromegaly.  There is no supraclavicular or inguinal  adenopathy.  ABDOMEN:  Soft and nontender.  No mass, organomegaly, ascites, or hernias  are noted.  The appendectomy scar is well healed.  PELVIC:  EG/BUS, vagina, bladder, and urethra are normal.  The cervix is  normal.  There is some bleeding from the cervix.  The uterus is retroverted  and upper limits of normal size.  There is no adnexal mass noted.  Rectovaginal exam confirms.   Patient's CT scan and ultrasound are reviewed.   IMPRESSION:  Endometrial adenocarcinoma or possible carcinosarcoma.   I have recommended the patient undergo a total abdominal hysterectomy with  bilateral salpingo-oophorectomy, intraoperative staging, including pelvic  and periaortic lymphadenectomy.  The patient is not eligible for GOG  protocol #210 because her past history  of breast cancer as well as the  uncertainty of the true histology of this case.  The risks of surgery,  including hemorrhage, infection, injury to adjacent viscera and embolic  complications and anesthetic risks were outlined to the patient and her  friend.  They understand these risks, and all questions are answered.  We  will proceed with surgery in conjunction with Dr. Ashley Royalty on April 11, 2004.                                               De Blanch, M.D.    DC/MEDQ  D:  04/04/2004  T:  04/05/2004  Job:  [id number]   cc:   Fayrene Fearing A. Ashley Royalty, M.D.  [address]  [place], [place] [address]  Fax: [phone]   Lenon Curt. Chilton Si, M.D.  [address]  [place]  [place] [address]  Fax: [phone]   Telford Nab, R.N.  [address]  [place], [place] [address]

## 2011-03-02 NOTE — Consult Note (Signed)
NAME:  [name], [name]                 ACCOUNT NO.:  [id number]   MEDICAL RECORD NO.:  [id number]          PATIENT TYPE:  OUT   LOCATION:  GYN                          FACILITY:  [facility]   PHYSICIAN:  De Blanch, M.D.DATE OF BIRTH:  1930-09-14   DATE OF CONSULTATION:  08/24/2005  DATE OF DISCHARGE:                                   CONSULTATION   A 77 year old white female returns for continuing followup of endometrial  cancer.   INTERVAL HISTORY:  Since her last visit, the patient has continued to do  well. She denies any GI or GU symptoms, has no pelvic pain, pressure,  vaginal bleeding or discharge. Her functional status is excellent.   HISTORY OF PRESENT ILLNESS:  The patient underwent initial surgery in June  of 2005. She was found to have stage IIIC endometrial carcinoma. She had  multiple positive pelvic and periaortic lymph nodes which were resected. She  received whole pelvis and extended field radiation therapy postoperatively.   PAST MEDICAL HISTORY:  Medical illnesses - bilateral breast cancer status  post excision and radiation therapy with tamoxifen for 5 years,  hypertension, anxiety disorder, obsessive compulsive disease, all of which  are stable.   CURRENT MEDICATIONS:  Darvocet, lorazepam, Toprol.   PAST SURGICAL HISTORY:  Appendectomy, tonsils and adenoidectomy, bilateral  cataract removal, ear surgery, bilateral breast lumpectomies, TAH/BSO,  pelvic and periaortic lymphadenectomy.   DRUG ALLERGIES:  None.   FAMILY HISTORY:  Negative for gynecologic, breast or colon cancer.   OBSTETRICAL HISTORY:  Gravida 2.   SOCIAL HISTORY:  The patient does not smoke, she lives alone and is retired.   REVIEW OF SYMPTOMS:  A 10 point comprehensive review of systems is performed  and is negative except for symptoms noted above.   PHYSICAL EXAMINATION:  VITAL SIGNS:  Weight 136 pounds, blood pressure  100/64, pulse 70, respiratory rate 18.  GENERAL:  The patient  is a healthy, slender, white female in no acute  distress.  HEENT:  Negative.  NECK:  Supple without thyromegaly. There was no supraclavicular or inguinal  adenopathy.  ABDOMEN:  Soft, nontender, no mass, organomegaly, ascites or hernias are  noted.  PELVIC:  EGBUS, vagina, bladder, urethra are normal. Cervix and uterus is  surgically absent. Adnexa without masses. Rectovaginal exam confirms.  EXTREMITIES:  Lower extremities without edema or varicosities.   IMPRESSION:  Stage IIIC endometrial cancer, no evidence of recurrent  disease.   PLAN:  Pap smears were obtained. The patient will return to see [place] in 4  months for continuing followup.      De Blanch, M.D.  Electronically Signed     DC/MEDQ  D:  08/24/2005  T:  08/25/2005  Job:  [id number]   cc:   Telford Nab, R.N.  [address]  [place], [place] [address]   Rudy Jew. Ashley Royalty, M.D.  Fax: [phone]   Lenon Curt. Chilton Si, M.D.  Fax: [phone]

## 2011-03-02 NOTE — Discharge Summary (Signed)
NAME:  [name], [name]                           ACCOUNT NO.:  [id number]   MEDICAL RECORD NO.:  [id number]                   PATIENT TYPE:  OBV   LOCATION:  9320                                 FACILITY:  [facility]   PHYSICIAN:  Rudy Jew. Ashley Royalty, M.D.             DATE OF BIRTH:  [birth year]   DATE OF ADMISSION:  03/27/2004  DATE OF DISCHARGE:  03/28/2004                                 DISCHARGE SUMMARY   DISCHARGE DIAGNOSES:  1. Probable uterine carcinoma.  Pathology pending.  2. History of breast carcinoma.  3. Hypertension.  4. Anxiety disorder.  5. Obsessive compulsive disorder.   PROCEDURES:  1. Diagnostic hysteroscopy.  2. Dilatation and curettage.   HISTORY AND PHYSICAL:  This is a 77 year old gravida 2, para 0, AB2 referred  through the courtesy of Dr. Frederik Pear for evaluation of a uterine mass.  CT  scan of the abdomen and pelvis on or about March 17, 2004, revealed a soft  tissue density of 3.5 cm in greatest diameter within the uterine cavity.  The patient was since brought in for diagnostic hysteroscopy and dilatation  and curettage in order to obtain a diagnosis.  She also had been noted on  ultrasound to have two complex cysts of the right ovary, 2.8 and 2.5 cm in  greatest diameter, respectively.  For the remainder of the history and  physical, please see the chart.   HOSPITAL COURSE:  The patient presented March 27, 2004, for [facility] and  hysteroscopy. She was taken to the operating room and underwent same on this  date.  No attempt was made to remove the uterine mass but simply to obtain a  diagnosis as it was strongly suspected the patient had carcinoma.  The  procedure was uncomplicated.  Due to the large amount of tissue obtained at  the procedure as well as potential for postoperative bleeding, and the fact  that the patient had minimal support system outside the hospital, decision  was made to keep her for 23-hour observation.  Her hospital course was  benign and she  was discharged on the first postoperative day, afebrile and  in satisfactory condition.   LABORATORY DATA:  Hemoglobin and hematocrit on admission were 17.3 and 53.2,  respectively.  Values obtained March 28, 2004, were 14.6 and 44.3,  respectively.   DISPOSITION:  The patient is returned to [facility] and  Obstetrics in two to three days for postoperative consultation after  pathology has returned.                                               James A. Ashley Royalty, M.D.    JAM/MEDQ  D:  04/05/2004  T:  04/05/2004  Job:  [id number]

## 2011-03-02 NOTE — Op Note (Signed)
NAME:  [name], [name]                           ACCOUNT NO.:  [id number]   MEDICAL RECORD NO.:  [id number]                   PATIENT TYPE:  INP   LOCATION:  0012                                 FACILITY:  [facility]   PHYSICIAN:  De Blanch, M.D.         DATE OF BIRTH:  08/13/1930   DATE OF PROCEDURE:  04/11/2004  DATE OF DISCHARGE:                                 OPERATIVE REPORT   PREOPERATIVE DIAGNOSES:  Poorly differentiated endometrial adenocarcinoma.   POSTOPERATIVE DIAGNOSES:  Poorly differentiated endometriosis  adenocarcinoma.   PROCEDURE:  Total abdominal hysterectomy, bilateral salpingo-oophorectomy,  pelvic and periaortic lymphadenectomy.   SURGEON:  De Blanch, M.D.   ASSISTANT:  Rudy Jew. Ashley Royalty, M.D., Telford Nab, R.N.   ANESTHESIA:  General with oral tracheal tube.   ESTIMATED BLOOD LOSS:  250 mL.   FINDINGS:  At the time of exploratory laparotomy, the upper abdomen  including the liver, diaphragm, spleen, stomach and omentum appeared normal.  The small bowel was also normal. The appendix was surgically absent.  The  uterus is normal size.  There was an inflammatory reaction and loose  adhesions around the right fallopian tube and sigmoid colon.  The omentum  was adherent to the right pericolic gutter from the patient's prior  appendectomy. There is no evidence of extrauterine metastases although one  aortic lymph node measured 2 x 1 cm.  Frozen section revealed that the  malignancy was high grade and invading the outer half of the myometrium.   DESCRIPTION OF PROCEDURE:  The patient was brought to the operating room and  after satisfactory attainment of general anesthesia was placed in the  modified lithotomy position in Darwin stirrups. The anterior abdominal wall,  perineum and vagina were prepped with Betadine, Foley catheter was inserted  and the patient was draped.  The abdomen was entered through a midline  incision which extended  above the umbilicus.  Peritoneal washings were  obtained.  The upper abdomen and pelvis were explored with the above noted  findings. Adhesions of the omentum to the right pericolic gutter were lysed  with sharp dissection and Bovie cautery for hemostasis. A Bookwalter  retractor was positioned and the bowel was packed out of the pelvis. The  uterus was grasped with two long Kelly clamps, the round ligaments were  divided and the retroperitoneal spaces opened identifying the external iliac  artery and vein, internal iliac artery and ureter.  The ovarian vessels were  skeletonized, clamped, cut, free tied and suture ligated.  The bladder flap  was advanced with sharp and blunt dissection. The uterine vessels were  skeletonized, clamped, cut, and suture ligated.  In a stepwise fashion, the  paracervical and cardinal ligaments were clamped, cut and suture ligated.  The rectovaginal septum was developed for further mobilization. The vaginal  angles were then cross clamped, divided and the cervix transected from its  junction to the vagina.  The vaginal angles were  transfixed with #0 Vicryl  and the central portion closed with interrupted figure-of-eight sutures of  #0 Vicryl that included the vaginal cuff and posterior pelvic peritoneum of  the rectovaginal septum.  Additional hemostasis in the bladder flap was  achieved with cautery.   Attention was turned to performing the pelvic lymphadenectomy.  Lymph nodes  overlying the external iliac artery and vein, internal iliac artery and  obturator fossa were excised. Care was taken to avoid vascular injury and  injury to the obturator nerve.  Hemostasis was achieved with cautery and  hemoclips.  A similar procedure was performed on the opposite side of the  pelvis.   Attention was then turned to performing a periaortic lymphadenectomy.  The  peritoneum overlying the right common iliac artery and along the aorta was  incised to above the level of  the ovarian artery.  The right ureter was  reflected laterally and placed behind the retractor. The duodenum was  mobilized superiorly and held behind the retractor. Thereafter the lymph  nodes were excised from the common iliac artery and along the aorta and vena  cava.  There was one enlarged lymph node noted in this dissection which was  fleshy. In order to completely resect the nodes, the ovarian artery on the  right was clipped near its origin and divided. The ovarian vein was  preserved.   Hemostasis in this region was quite good.   The pelvis was reinspected and found to be hemostatic.  Packs and retractors  were removed, the anterior abdominal wall was closed in layers with the  first being a running mass closure using #1 PDS.  The subcutaneous tissue  was irrigated, hemostasis achieved with cautery and the skin closed with  skin staples.  A dressing was applied.  The patient was awakened from  anesthesia and taken to the recovery room in satisfactory condition.  Sponge, needle and instrument counts were correct x2.                                               De Blanch, M.D.    DC/MEDQ  D:  04/11/2004  T:  04/11/2004  Job:  [id number]   cc:   Fayrene Fearing A. Ashley Royalty, M.D.  [address]  [place], [place] [address]  Fax: [phone]   Lenon Curt. Chilton Si, M.D.  [address]  [place]  [place] [address]  Fax: [phone]   Telford Nab, R.N.  [address]  [place], [place] [address]

## 2011-03-02 NOTE — Op Note (Signed)
NAME:  [name], [name]                           ACCOUNT NO.:  [id number]   MEDICAL RECORD NO.:  [id number]                   PATIENT TYPE:  AMB   LOCATION:  SDC                                  FACILITY:  [facility]   PHYSICIAN:  James A. Ashley Royalty, M.D.             DATE OF BIRTH:  1930/04/06   DATE OF PROCEDURE:  03/27/2004  DATE OF DISCHARGE:                                 OPERATIVE REPORT   PREOPERATIVE DIAGNOSIS:  Intrauterine mass - rule out endometrial carcinoma.   POSTOPERATIVE DIAGNOSIS:  Intrauterine mass - rule out endometrial  carcinoma, pathology pending.   PROCEDURES:  1. Diagnostic hysteroscopy with biopsy.  2. Dilatation and curettage.   SURGEON:  Rudy Jew. Ashley Royalty, M.D.   ANESTHESIA:  1% Xylocaine paracervical block (20 mL total).   COMPLICATIONS:  None.   PACKS AND DRAINS:  None.   DESCRIPTION OF PROCEDURE:  The patient was taken to the operating room and  placed in the dorsal supine position.  She was then placed in the lithotomy  position and prepped and draped in the usual manner for vaginal surgery.  Posterior weighted retractor was placed per vagina.  The anterior lip of the  cervix was grasped with single-tooth tenaculum.  Approximately 20 mL of 1%  Xylocaine were instilled into the cervix to create a paracervical block.  At  the patient's request, she received no additional anesthetic agents.  The  uterus was gently sounded to approximately 12 cm.  Upon sounding the uterus,  moderate amount of brownish fluid was noted to be released from the cervical  os.  The cervix was then dilated to a size 29 [place] using [employer] dilators.  Operative hysteroscope was then placed into the uterine cavity using  Sorbitol as a distention median.  Visualization was difficult due to the  depth of the cavity and serosanguineous fluid __________ absorbed quite a  bit of the light.  The right tubal ostia was seen.  The left tubal ostia was  not seen despite significant efforts.   There appeared to be several masses  contained within the uterine cavity.  There are areas of atrophy and areas  of apparent proliferation.  A biopsy was obtained from one of the masses and  the specimen submitted separately to pathology for histologic studies.   Next, attention was turned to the curettage.  First, an endocervical  curettage was performed using the Kevorkian curette.  The specimen was  submitted separated to pathology for  histologic studies.  Next, an  endometrial curettage was performed.  First the four quadrant technique was  used.  Then, a therapeutic technique was used.  The curettings were  submitted separately to pathology for histologic studies.  The tissue was  obtained was moderate in amount and concerning for neoplasia.   At this point, the patient was felt to have benefitted maximally from this  surgical procedure.  the vaginal instruments  were removed.  Hemostasis was  noted.  The procedure was terminated.  The patient tolerated the procedure  extremely well and was returned to the recovery room in good condition.   Due to the patient's minimal support system, will elect to hold her at  [facility] on 23-hour observation and hopefully she will be stable for  discharge in the a.m.                                               James A. Ashley Royalty, M.D.    JAM/MEDQ  D:  03/27/2004  T:  03/27/2004  Job:  [id number]

## 2011-03-02 NOTE — Consult Note (Signed)
NAME:  [name], [name]                 ACCOUNT NO.:  [id number]   MEDICAL RECORD NO.:  [id number]          PATIENT TYPE:  OUT   LOCATION:  GYN                          FACILITY:  [facility]   PHYSICIAN:  De Blanch, M.D.DATE OF BIRTH:  Mar 06, 1930   DATE OF CONSULTATION:  01/11/2006  DATE OF DISCHARGE:  01/11/2006                                   CONSULTATION   CHIEF COMPLAINT:  Endometrial cancer.   INTERVAL HISTORY:  Since her last visit, the patient has done well.  She  denies any GI or GU symptoms, has no pelvic pain or pressure, vaginal bleed  or discharge.  Functional status is excellent.   HISTORY OF PRESENT ILLNESS:  The patient has a stage IIIC endometrial  cancer, undergoing initial surgery in June of 2005.  She had multiple  positive pelvic and aortic lymph nodes and received whole pelvis and  extended field radiation therapy postoperatively.   PAST MEDICAL HISTORY:   MEDICAL ILLNESSES:  1.  Bilateral breast cancer, status post excision and radiation therapy and      5 years of tamoxifen.  2.  Hypertension.  3.  Anxiety disorder.  4.  Obsessive compulsive disease.   CURRENT MEDICATIONS:  1.  Darvocet.  2.  Lorazepam.  3.  Toprol.  4.  Fish oil.  5.  Baby aspirin.   PAST SURGICAL HISTORY:  1.  Appendectomy.  2.  Tonsils and adenoidectomy.  3.  Bilateral cataract removal.  4.  Ear surgery.  5.  Bilateral breast lumpectomies.  6.  TAH/BSO.  7.  Pelvic and periaortic lymphadenectomy.   DRUG ALLERGIES:  None.   FAMILY HISTORY:  Negative for gynecologist, breast or colon cancer.   OBSTETRICAL HISTORY:  Gravida 2.   SOCIAL HISTORY:  The patient does not smoke.  She lives alone.  She is  retired.   REVIEW OF SYSTEMS:  A 10-point comprehensive review of systems is negative,  except as noted above.   PHYSICAL EXAMINATION:  VITAL SIGNS:  Weight 136 pounds, blood pressure  100/70.  GENERAL:  The patient is a healthy, slender white female in no acute  distress.  HEENT:  Negative.  NECK:  Supple without thyromegaly.  There is no supraclavicular or inguinal  adenopathy.  ABDOMEN:  Soft and nontender.  No masses, organomegaly, ascites or hernias  are noted.  Midline incision is well healed.  PELVIC:  EGBUS, vagina, bladder, urethra are normal but slightly atrophic.  No lesions are noted.  Bilateral and rectovaginal exam reveal no masses,  induration or nodularity.   IMPRESSION:  Stage IIIC endometrial carcinoma.  No evidence of recurrent  disease.   PLAN:  Pap smear was obtained.  The patient is to return to see [place] in 4  months for continuing follow up.  She will continue to have mammograms as  scheduled on an annual basis.      De Blanch, M.D.  Electronically Signed     DC/MEDQ  D:  01/11/2006  T:  01/14/2006  Job:  [id number]   cc:   Telford Nab, R.N.  [address]  [place], [place] [address]   Rudy Jew. Ashley Royalty, M.D.  Fax: [phone]   Lenon Curt. Chilton Si, M.D.  Fax: [phone]

## 2011-03-02 NOTE — Discharge Summary (Signed)
NAME:  [name], [name]                           ACCOUNT NO.:  [id number]   MEDICAL RECORD NO.:  [id number]                   PATIENT TYPE:  INP   LOCATION:  0440                                 FACILITY:  [facility]   PHYSICIAN:  Rudy Jew. Ashley Royalty, M.D.             DATE OF BIRTH:  24-Sep-1930   DATE OF ADMISSION:  04/11/2004  DATE OF DISCHARGE:  04/14/2004                                 DISCHARGE SUMMARY   DISCHARGE DIAGNOSES:  1. Endometrial adenocarcinoma, grade 3, with deep myometrial invasion and     4/14 lymph nodes positive.  2. History of breast cancer.  3. Hypertension.  4. Anxiety disorder.  5. Obsessive-compulsive disorder.   OPERATIONS AND SPECIAL PROCEDURES:  1. Total abdominal hysterectomy, bilateral salpingo-oophorectomy.  2. Pelvic/aortic lymphadenectomy.   CONSULTATIONS:  None.   DISCHARGE MEDICATIONS:  Percocet.   HISTORY AND PHYSICAL:  This is a 77 year old patient referred by me to Dr.  De Blanch for adenocarcinoma of the endometrium diagnosed  recently at [facility] hysteroscopy.  For the remainder of the history and physical  please see the chart.   HOSPITAL COURSE:  The patient was admitted to [facility]  Admission laboratory studies were drawn.  On April 11, 2004 she was taken to  the operating room and underwent total abdominal hysterectomy, bilateral  salpingo-oophorectomy, as well as pelvic/aortic lymphadenectomy.  The  procedure was accomplished by Dr. Serita Kyle with Dr. Sylvester Harder  assisting.  The procedure was uncomplicated.  The patient's postoperative  course was essentially benign.  She was discharged home on postoperative day  afebrile and in satisfactory condition.   ACCESSORY CLINICAL FINDINGS:  Hemoglobin and hematocrit on admission were  15.6 and 45.5 respectively.  White blood count was 6100.  Repeat values were  obtained April 12, 2004 and revealed hemoglobin 15.3; hematocrit 44.7; and  white blood count 12,600.   Type and Rh revealed O positive blood.   DISPOSITION:  The patient is to receive a home visit by nurse Aundria Rud next  week.  She is to return to Dr. Jesusita Oka Clarke-Pearson's office in approximately  2 weeks in order to discuss the pathology and entertain any adjuvant  therapy.                                               James A. Ashley Royalty, M.D.    JAM/MEDQ  D:  05/04/2004  T:  05/04/2004  Job:  [id number]

## 2011-03-02 NOTE — Consult Note (Signed)
NAME:  [name], [name]                           ACCOUNT NO.:  [id number]   MEDICAL RECORD NO.:  [id number]                   PATIENT TYPE:  OUT   LOCATION:  GYN                                  [facility]ACILITY[facility]   PHYSICIAN:  De Blanch, M.D.         DATE OF BIRTH:  [birth year]   DATE OF CONSULTATION:  05/10/2004  DATE OF DISCHARGE:                                   CONSULTATION   REASON FOR CONSULTATION:  A 77 year old white female returns for continuing  followup, having undergone a total abdominal hysterectomy, bilateral  salpingo-oophorectomy, pelvic and periaortic lymphadenectomy on April 11, 2004.  She was found to have multiple pelvic and periaortic lymph node  metastases.  Her problems postoperatively have been that of vaginal  drainage.  This was evaluated recently by using oral Peridium and placing a  tampon.  There was no evidence of Peridium on the tampon, although the  patient's urine was orange.  She has continued to drain and wears a pad  continually.  Subsequently, a vaginal fistulogram was obtained on May 01, 2004, that showed a vaginal peritoneal fistula.  The patient denies any  signs of peritonitis, specifically denies any abdominal pain or fever.  She  is scheduled to see the radiation therapist in the near future to discuss  adjuvant radiation therapy given her advanced disease with nodal  involvement.   PHYSICAL EXAMINATION:  ABDOMEN:  Soft, nontender.  No mass, organomegaly,  ascites, or hernias are noted.  Incision seems to be well-healed.  PELVIC:  EGBUS, vagina, bladder, and urethra are normal.  Careful inspection  of the vaginal cuff shows a minute fistulous tract less than 1 mm in  diameter at the left side of the vaginal cuff.  Fluid is seen to come out of  that.  Some of this fluid is aspirated and submitted for creatinine  evaluation.  Despite the fact of a negative tampon test, we need to be  certain that this is not an urinary tract fistula.   If there is no evidence  of urine in this fluid, then I would strongly consider returning to the  operating room and reclosing her vaginal cuff through a vaginal approach  under spinal anesthesia.  The patient strongly desires to have this done as  the drainage is significant.   We will await the creatinine level on the fluid and make arrangements  appropriately thereafter.                                               De Blanch, M.D.    DC/MEDQ  D:  05/10/2004  T:  05/10/2004  Job:  [id number]   cc:   Telford Nab, R.N.  [address]  [place], [place] [address]

## 2011-03-02 NOTE — H&P (Signed)
NAME:  [name], KERNAN                           ACCOUNT NO.:  [id number]   MEDICAL RECORD NO.:  [id number]                   PATIENT TYPE:  AMB   LOCATION:  SDC                                  FACILITY:  [facility]   PHYSICIAN:  James A. Ashley Royalty, M.D.             DATE OF BIRTH:  01-23-1930   DATE OF ADMISSION:  03/27/2004  DATE OF DISCHARGE:                                HISTORY & PHYSICAL   This is a 77 year old gravida 2, para 0, AB2 referred through the courtesy  of Dr. Frederik Pear for evaluation  of a uterine mass.  The patient stated she  had a CT scan of the abdomen and pelvis ordered through the office of Dr.  Chilton Si performed on or about March 17, 2004. The CT scan revealed a soft tissue  density of 3.5 x 2 cm in diameter contained within the uterine cavity.  There were also several small right adnexal masses consistent with cysts.   Subsequent ultrasound was ordered and performed March 18, 2004.  The  ultrasound revealed a 3.8 x 3.6 cm mass along the posterior endometrial  cavity as well as large amount of fluid in the uterine canal.  There were  two complex cysts of the right ovary, one 2.5 cm in the greatest diameter  and the other 2.8 cm in greatest diameter.  The patient is for diagnostic  hysteroscopy and dilatation and curettage.   MEDICATIONS:  Darvocet, Lorazepam, fluvoxamine, and Toprol.   PAST MEDICAL HISTORY:  1. History of breast carcinoma.  2. Hypertension.  3. Anxiety disorder.  4. OCD.   PAST SURGICAL HISTORY:  Lumpectomy in the left, then right breast (one year  later).   ALLERGIES:  No known drug allergies.   FAMILY HISTORY:  Noncontributory.   SOCIAL HISTORY:  The patient denies use of tobacco.  She drinks  approximately two glasses of wine daily.   REVIEW OF SYMPTOMS:  Noncontributory.   PHYSICAL EXAMINATION:  GENERAL APPEARANCE:  A well-developed, well-  nourished, pleasant black female in no acute distress.  VITAL SIGNS:  Afebrile with vital signs  stable.  SKIN:  Warm and dry without lesions.  LYMPHS:  There are no supraclavicular, cervical or inguinal adenopathy.  HEENT:  Normocephalic.  NECK:  Supple without thyromegaly.  CHEST:  Lungs are clear.  CARDIOVASCULAR:  Regular rate and rhythm without murmurs, rubs, or gallops.  BREASTS:  Bilateral without dominant mass, discharge or __________  adenopathy.  There are markings consistent with previous radiation therapy  present bilaterally.  ABDOMEN:  Soft and nontender without masses or organomegaly.  Bowel sounds  are active.  MUSCULOSKELETAL:  Full range of motion without edema, cyanosis or CVA  tenderness.  PELVIC: Examination (March 23, 2004).  External genitalia within normal  limits.  Vaginal and cervix are without gross lesions.  Pap is deferred.  Bimanual examination reveals the uterus to be approximately 9 x 5  x 4 cm and  no adnexal masses are palpable.   IMPRESSION:  1. A 3.8 x 3.6 cm intrauterine mass on recent ultrasound.  2. Complex cyst of the right ovary.  Differential includes benign versus     malignant neoplasm.   PLAN:  1. Diagnostic hysteroscopy.  2. Dilatation and curettage.   Risks, benefits, complications and alternatives fully discussed with the  patient.  She states she understands and accepts.  Questions invited and  answered.                                               James A. Ashley Royalty, M.D.    JAM/MEDQ  D:  03/27/2004  T:  03/27/2004  Job:  [id number]

## 2011-03-02 NOTE — Consult Note (Signed)
NAME:  [name], [name]                 ACCOUNT NO.:  [id number]   MEDICAL RECORD NO.:  [id number]          PATIENT TYPE:  OUT   LOCATION:  GYN                          FACILITY:  [facility]   PHYSICIAN:  De Blanch, M.D.DATE OF BIRTH:  [birth year]   DATE OF CONSULTATION:  01/19/2005  DATE OF DISCHARGE:                                   CONSULTATION   A 77 year old white female returns for continuing follow-up of stage IIIC  endometrial carcinoma.   INTERVAL HISTORY:  Since her last visit, the patient has done well.  She  denies any GI or GU symptoms, has no pelvic pain, pressure, vaginal  bleeding, or discharge.  Functional status is quite good.   HISTORY OF PRESENT ILLNESS:  The patient was found to have a stage IIIC  endometrial carcinoma, undergoing initial surgery in June 2005.  She had  multiple positive pelvic and periaortic lymph nodes and was treated with  extended field radiation therapy postoperatively.  She tolerated the  radiation therapy quite well.   PAST MEDICAL HISTORY:   MEDICAL ILLNESSES:  1.  Bilateral breast cancer, status post excision, radiation therapy, and      Tamoxifen.  2.  Hypertension.  3.  Anxiety disorder/obsessive compulsive disease.   CURRENT MEDICATIONS:  Darvocet, lorazepam, and Toprol.   PAST SURGICAL HISTORY:  1.  Appendectomy.  2.  Tonsil and adenoidectomy.  3.  Bilateral cataract removal.  4.  Ear surgery.  5.  Bilateral breast lumpectomies.  6.  TAH/BSO.  7.  Pelvic and periaortic lymphadenectomy.   DRUG ALLERGIES:  None.   FAMILY HISTORY:  Negative for gynecologic, breast, or colon cancer.   OBSTETRICAL HISTORY:  Gravida 2.   SOCIAL HISTORY:  The patient does not smoke.  She lives alone.   REVIEW OF SYSTEMS:  Negative except as noted above.   PHYSICAL EXAMINATION:  VITAL SIGNS:  Weight 128 pounds, blood pressure  130/60.  GENERAL:  The patient is a slender, healthy white female in no acute  distress.  HEENT:   Negative.  NECK:  Supple without thyromegaly.  There is no supraclavicular, axillary,  or inguinal adenopathy.  ABDOMEN:  Soft, nontender.  No masses, organomegaly, ascites, or hernias are  noted.  Midline incision is well-healed.  PELVIC EXAM:  EG/BUS, vagina, bladder, and urethra are normal.  Her  introitus is relatively narrow and is just unidigital.  Bimanual and  rectovaginal exam reveal no masses, induration, or nodularity.   IMPRESSION:  Stage IIIC endometrial cancer, status post resection and  extended field radiation therapy.  The patient is clinically free of  disease.  A narrow introitus.   PLAN:  Pap smears are obtained.  The patient is given a vaginal dilator to  be used with Astroglide or other nonhormonal lubricants.  She will return to  see [place] for a follow up visit in 3 months.      DC/MEDQ  D:  01/20/2005  T:  01/20/2005  Job:  [id number]   cc:   Fayrene Fearing A. Ashley Royalty, M.D.  [address]  [place], [place] [address]  Fax: [phone]   Lenon Curt. Chilton Si, M.D.  [address]  [place]  [place] [address]  Fax: [phone]   Telford Nab, R.N.  [address]  [place], [place] [address]

## 2011-03-02 NOTE — Consult Note (Signed)
NAME:  [name], [name]                 ACCOUNT NO.:  [id number]   MEDICAL RECORD NO.:  [id number]          PATIENT TYPE:  OUT   LOCATION:  GYN                          FACILITY:  [facility]   PHYSICIAN:  De Blanch, M.D.DATE OF BIRTH:  11-Aug-1930   DATE OF CONSULTATION:  04/30/2006  DATE OF DISCHARGE:                                   CONSULTATION   CHIEF COMPLAINT:  Endometrial cancer.   INTERVAL HISTORY:  Since her last visit, she has done well.  She denies any  GI or GU symptoms, has no pelvic pain, pressure, vaginal bleeding, or  discharge.  Functional status is excellent and she has no complaints.   HISTORY OF PRESENT ILLNESS:  The patient was found to have a stage III C  endometrial carcinoma in June 2005.  She underwent surgery including  resection of multiple positive pelvic and periaortic lymph nodes.  She  received whole pelvis and extended field radiation therapy postoperatively  and has been followed since that time.   The patient recently had mammograms that were negative.   PAST MEDICAL HISTORY:  Medical illnesses:  Bilateral breast cancer, status  post excision and radiation therapy, and five years of Tamoxifen.  Hypertension.  Anxiety disorder.  Obsessive-compulsive disease.   CURRENT MEDICATIONS:  Darvocet, lorazepam, Toprol, fish oil, and baby  aspirin.   PAST SURGICAL HISTORY:  Appendectomy, tonsillectomy and adenoidectomy,  bilateral cataract removal, ear surgery, bilateral breast lumpectomies,  TAH/BSO, pelvic and periaortic lymphadenectomy.   DRUG ALLERGIES:  None.   FAMILY HISTORY:  Negative for gynecologic breast or colon cancer.  Para 2,  gravida 2.   SOCIAL HISTORY:  The patient does not smoke.  She lives alone.  She is  retired.   REVIEW OF SYSTEMS:  A 10-opint comprehensive review of systems negative  except as noted above.   PHYSICAL EXAMINATION:  VITAL SIGNS:  Weight 143 pounds.  Blood pressure  128/70, pulse 80, respiratory rate 20.  GENERAL:  The patient is a healthy white female in no acute distress.  HEENT:  Negative.  NECK:  Supple without thyromegaly.  There is no supraclavicular or inguinal  adenopathy.  ABDOMEN:  Soft, nontender.  No mass or organomegaly, ascites or hernias  noted.  PELVIC:  EG/BUS, vagina, urethra normal but slightly atrophic.  No lesions  are noted.  Bimanual and rectovaginal exam revealed no masses, induration,  or nodularity.  EXTREMITIES:  Lower extremities are without edema or varicosities.   IMPRESSION:  Stage III C endometrial cancer.  No evidence of recurrent  disease.   PLAN:  Pap smears were obtained today.  The patient will return to see [place] in  six months.      De Blanch, M.D.  Electronically Signed     DC/MEDQ  D:  04/30/2006  T:  05/01/2006  Job:  [id number]   cc:   Telford Nab, R.N.  [address]  [place], [place] [address]   Rudy Jew. Ashley Royalty, M.D.  Fax: [phone]   Lenon Curt. Chilton Si, M.D.  Fax: [phone]

## 2011-03-02 NOTE — Consult Note (Signed)
NAME:  [name], [name]                 ACCOUNT NO.:  [id number]   MEDICAL RECORD NO.:  [id number]          PATIENT TYPE:  OUT   LOCATION:  GYN                          FACILITY:  [facility]   PHYSICIAN:  De Blanch, M.D.DATE OF BIRTH:  [birth year]   DATE OF CONSULTATION:  10/03/2004  DATE OF DISCHARGE:                                   CONSULTATION   A 77 year old white female returns for continuing followup of stage IIIC  endometrial carcinoma.   INTERVAL HISTORY:  Since her last visit with me, the patient has completed a  course of external beam radiation therapy and tolerated it well with minimal  nausea and no diarrhea.   HISTORY OF PRESENT ILLNESS:  The patient underwent initial TAH/BSO, pelvic  and periaortic lymphadenectomy, June 2005.  She was found to have multiple  pelvic and periaortic lymph nodes with metastases.  She was treated with  whole pelvis and extended field radiation therapy thereafter.   PAST MEDICAL HISTORY:  Bilateral breast cancer 8 and 7 years ago status post  wide excision radiation therapy and tamoxifen, hypertension, anxiety  disorder, obsessive compulsive disease.   CURRENT MEDICATIONS:  Darvocet, lorazepam and Toprol.   PAST SURGICAL HISTORY:  Appendectomy, tonsils and adenoidectomy, bilateral  cataract removal, ear surgery, bilateral breast lumpectomies, TAH/BSO,  pelvic and periaortic lymphadenectomy.   ALLERGIES:  None.   FAMILY HISTORY:  Negative for gynecologic, breast or colon cancer.   OBSTETRICAL HISTORY:  Gravida 2.   SOCIAL HISTORY:  The patient does not smoke, she lives alone.  She has a  daughter who disappeared about 2.5 years ago and has not been found.   REVIEW OF SYMPTOMS:  Negative except as noted above.   PHYSICAL EXAMINATION:  VITAL SIGNS:  Weight 135 pounds, blood pressure  110/72,  pulse 80.  GENERAL:  The patient is a healthy white female in no acute distress.  HEENT:  Negative.  NECK:  Supple without thyromegaly.  There was no supraclavicular or inguinal  adenopathy.  ABDOMEN:  Soft, nontender, no mass, organomegaly, ascites or hernias are  noted.  A midline incision is well healed.  PELVIC:  EGBUS, vagina, bladder, urethra are normal but atrophic.  Bimanual  and rectovaginal exam reveal no masses, indurations or nodularity.   IMPRESSION:  Stage IIIC endometrial cancer status post resection and  extended field radiation therapy. The patient is clinically free of disease.   PLAN:  Pap smears are obtained from the vagina. The patient returned to see  [place] in three months for continuing followup.     Dani   DC/MEDQ  D:  10/03/2004  T:  10/03/2004  Job:  [id number]   cc:   Wynn Banker, M.D.  [address] [facility]  [place]  [place] [address]  Fax: [phone]   Fayrene Fearing A. Ashley Royalty, M.D.  [address]  [place], [place] [address]  Fax: [phone]   Lenon Curt. Chilton Si, M.D.  [address]  [place]  [place] [address]  Fax: [phone]   Telford Nab, R.N.  [address]  [place], [place] [address]

## 2011-03-02 NOTE — Consult Note (Signed)
NAME:  [name], [name]                           ACCOUNT NO.:  [id number]   MEDICAL RECORD NO.:  [id number]                   PATIENT TYPE:  OUT   LOCATION:  GYN                                  FACILITY[facility]  [facility]   PHYSICIAN:  De Blanch, M.D.         DATE OF BIRTH:  September 15, 1930   DATE OF CONSULTATION:  04/26/2004  DATE OF DISCHARGE:                                   CONSULTATION   HISTORY OF PRESENT ILLNESS:  A 77 year old white female who returns for  review of her pathology obtained at recent surgery for endometrial cancer.  Since her last visit, she has done reasonably well.  She does note a thin  watery vaginal discharge which requires a pad used once a day.  She is not  having any urinary tract symptoms, and denies any vaginal bleeding.  She is  gradually improving her functional status as she recovers from surgery.  Her  appetite is good.   PHYSICAL EXAMINATION:  ABDOMEN:  Soft and nontender.  No mass, organomegaly,  ascites, or hernias are noted.  PELVIC:  EGBUS, vagina, bladder, urethra appear normal.  The vaginal cuff is  healing well.  I do not see any granulation tissue or any drainage  whatsoever, despite the fact of leaving the speculum in for several minutes.  Bimanual examination reveals postoperative induration of the cuff, which is  appropriate for this point in time.  No other abnormalities are noted.   IMPRESSION:  Stage IIIC poorly-differentiated endometrial cancer.  Specific  pathologic findings include a deeply invasive grade III lesion with  involvement of lymph vascular channels, 2 positive pelvic nodes, and 2  positive aortic nodes.  Peritoneal cytology is inconclusive.   I had a discussion with the patient and her son regarding the surgical  findings, pathologic findings, and my recommendation.  The patient received  extended field radiation therapy to cover the pelvis and periatoric chain.  Prior to initiating therapy, we will obtain a CT scan of  the chest to make  certain there is no mediastinal adenopathy or pulmonary lesions.   FOLLOW UP:  1. We will arrange for the patient to be seen by Dr. Kathrin Greathouse, who     had previously delivered her radiation therapy for her breast.  2. She will return to see me for a 6-week postoperative check up.  She will     also call and notify us if she has any worsening of this vaginal     discharge.  I do not believe it represents a vesicovaginal fistula, but     that certainly would be considered if her symptoms worsen.                                               De Blanch, M.D.  DC/MEDQ  D:  04/26/2004  T:  04/26/2004  Job:  [id number]   cc:   Fayrene Fearing A. Ashley Royalty, M.D.  [address]  [place], [place] [address]  Fax: [phone]   Lenon Curt. Chilton Si, M.D.  [address]  [place]  [place] [address]  Fax: [phone]   Wynn Banker, M.D.  [address] [facility]  [place]  [place] [address]  Fax: [phone]   Telford Nab, R.N.  [phone]  [place], [place] [address]

## 2011-03-02 NOTE — Consult Note (Signed)
NAME:  [name], [name]                           ACCOUNT NO.:  [id number]   MEDICAL RECORD NO.:  [id number]                   PATIENT TYPE:  MAT   LOCATION:  MATC                                 FACILITY:  [facility]   PHYSICIAN:  De Blanch, M.D.         DATE OF BIRTH:  [birth year]   DATE OF CONSULTATION:  DATE OF DISCHARGE:                                   CONSULTATION   REASON FOR CONSULTATION:  The patient presents today for reevaluation.  She  had been having difficulty with a peritoneal-vaginal fistula.  She reports  that over the last 3 days she has had no further leakage.  We had planned on  repairing the fistulous tract next week.  She denies any other GI or GU  symptoms.   PHYSICAL EXAMINATION:  VITAL SIGNS:  Weight 130 pounds, blood pressure  130/78.  ABDOMEN:  The abdomen is soft, nontender.  No mass, organomegaly, ascites,  or hernias noted.  She has recently been marked for radiation therapy.  PELVIC:  EG/BUS, vagina, bladder, urethra are normal.  Careful inspection of  the upper vagina reveals no fistulous tract and no leaking.   IMPRESSION:  Resolved peritoneal-vaginal fistula.   PLAN:  The patient will go ahead with planned institution of radiation  therapy next week and we will continue to follow the patient along after  completing her radiation therapy course.                                               De Blanch, M.D.    DC/MEDQ  D:  05/31/2004  T:  05/31/2004  Job:  [id number]   cc:   Wynn Banker, M.D.  [address] [facility]  [place]  [place] [address]  Fax: [phone]   Fayrene Fearing A. Ashley Royalty, M.D.  [address]  [place], [place] [address]  Fax: [phone]   Lenon Curt. Chilton Si, M.D.  [address]  [place]  [place] [address]  Fax: [phone]   Telford Nab, R.N.  [address]  [place], [place] [address]

## 2011-03-02 NOTE — Consult Note (Signed)
NAME:  [name], [name]                 ACCOUNT NO.:  [id number]   MEDICAL RECORD NO.:  [id number]          PATIENT TYPE:  OUT   LOCATION:  GYN                          FACILITY:  [facility]   PHYSICIAN:  De Blanch, M.D.DATE OF BIRTH:  04-12-1930   DATE OF CONSULTATION:  DATE OF DISCHARGE:                                   CONSULTATION   A 77 year old white female seen in continuing followup of endometrial  cancer.   INTERVAL HISTORY:  Since her last visit she has done well.  She denies any  GI or GU symptoms.  There is no pelvic pain, pressure vaginally or  discharge.  She does note that she has had several episodes of dizziness.  She apparently was told to reduce her dose of Toprol, but she has been  unable to break it in half.   HISTORY OF PRESENT ILLNESS:  The patient has Stage IIIc endometrial  carcinoma undergoing initial surgery June 2005.  She had multiple positive  pelvic and paraaortic lymph nodes and was treated with extended field  radiation therapy postoperatively.  She tolerated the radiation therapy well  and has been followed with no evidence of recurrent disease since then.   PAST MEDICAL HISTORY:  Medical illnesses:  Bilateral breast cancer status  post excision and radiation therapy and Tamoxifen.  Hypertension.  Anxiety  disorder.  Obsessive-compulsive disease (stable).   CURRENT MEDICATIONS:  Darvocet.  Lorazepam.  Toprol.   PAST SURGICAL HISTORY:  Appendectomy.  Tonsils and adenoidectomy.  Bilateral  cataract removal.  Ear surgery.  Bilateral breast lumpectomies.  TAH BSO.  Pelvic and paraaortic lymphadenectomy.   DRUG ALLERGIES:  None.   FAMILY HISTORY:  Negative for gynecologic, breast or colon cancer.   OBSTETRICAL HISTORY:  Gravida 2.   SOCIAL HISTORY:  The patient does not smoke.  She lives alone.  She is  retired.   REVIEW OF SYSTEMS:  A 10-point review of systems which is comprehensive is  performed and is negative except for symptoms noted  above.   PHYSICAL EXAMINATION:  VITAL SIGNS:  Weight 135 pounds, blood pressure  110/68.  GENERAL:  The patient is healthy white female in no acute distress.  HEENT:  Negative.  NECK:  Supple without thyromegaly.  There is no supraclavicular or inguinal  adenopathy.  ABDOMEN:  Soft and nontender, no mass, organomegaly, ascites or hernias  noted.  PELVIC:  EG/BUS, vagina, bladder and urethra are normal.  Cuff is well  healed, well supported, no lesions noted.  Bimanual and rectovaginal exam  reveal no masses, induration or nodularity.  EXTREMITIES:  Lower extremities without edema or varicosities.   IMPRESSION:  1.  Stage IIIc endometrial carcinoma, status post resection and extended-      field radiation therapy.  She remains clinically free of disease.  She      is doing much better, using a vaginal dilator, and I am able to do a      very adequate exam today.  She will continue using the dilator.  Pap      smears are obtained today.  2.  Dizziness of uncertain etiology.  It may be associated with some      hypotension given that her blood pressure has been relatively low on the      last several visits, and she had been advised to decrease the dose of      her antihypertensive.  She will contact her primary physician and adjust      her antihypertensive medications appropriately.   She will return to see me in 6 months.       DC/MEDQ  D:  05/11/2005  T:  05/11/2005  Job:  [id number]   cc:   Fayrene Fearing A. Ashley Royalty, M.D.  [address]  [place], [place] [address]  Fax: [phone]   Lenon Curt. Chilton Si, M.D.  [address]  [place]  [place] [address]  Fax: [phone]   Telford Nab, R.N.  [phone]  [place], [place] [address]

## 2011-03-02 NOTE — Consult Note (Signed)
NAME:  [name], [name]                 ACCOUNT NO.:  [id number]   MEDICAL RECORD NO.:  [id number]          PATIENT TYPE:  OUT   LOCATION:  GYN                          FACILITY:  [facility]   PHYSICIAN:  De Blanch, M.D.DATE OF BIRTH:  01-10-1930   DATE OF CONSULTATION:  DATE OF DISCHARGE:                                 CONSULTATION   CHIEF COMPLAINT:  Endometrial cancer.   INTERVAL HISTORY:  Since her last visit, the patient has done reasonably  well.  She did have several episodes in the same day of dizziness and  underwent a CT scan of the brain on June 26, 2006.  There was no  evidence of intracranial abnormality, although she has chronic ischemic  changes and a left mastoiditis.  There was no evidence of metastatic  brain disease.  Otherwise, she denies any GI or GU symptoms.  Has no  abdominal pain, pressure, vaginal bleeding, or discharge.   HISTORY OF PRESENT ILLNESS:  Patient has stage IIIC endometrial cancer.  Initially underwent surgery in June, 2005.  She had extensive resection  of multiple positive pelvic and periaortic lymph nodes.  Postoperatively, she received whole pelvis and extended field radiation  therapy.  She has been followed since that time with no evidence of  recurrent disease.  Patient originally had mammograms which are  negative.   PAST MEDICAL HISTORY:  1. Bilateral breast cancer.  2. Hypertension.  3. Anxiety disorder.  4. Obsessive-compulsive disease.   CURRENT MEDICATIONS:  Darvocet, Lorazepam, Toprol, baby aspirin.   PAST SURGICAL HISTORY:  Appendectomy, tonsillectomy and adenoidectomy,  bilateral cataract removal, ear surgery, bilateral breast lumpectomies,  TAH/BSO, pelvic and periaortic lymphadenectomy.   DRUG ALLERGIES:  None.   FAMILY HISTORY:  Negative for gynecologic, breast, or colon cancer.   OBSTETRICAL HISTORY:  Gravida 2.   SOCIAL HISTORY:  Patient does not smoke.  She lives alone.  She is  considering moving  closer to her family in [place].  She is retired.   REVIEW OF SYSTEMS:  A 10-point comprehensive review of systems is  negative, except as noted above.   PHYSICAL EXAMINATION:  VITAL SIGNS:  Weight 151 pounds.  Blood pressure  110/84.  Pulse 104.  Respiratory rate 20.  GENERAL:  Patient is a healthy, slender white female in no acute  distress.  HEENT:  Negative.  NECK:  Supple without thyromegaly.  There is no supraclavicular or  inguinal adenopathy.  ABDOMEN:  Soft and nontender.  No mass, organomegaly, ascites, or  hernias are noted.  PELVIC:  EG/BUS, vagina, bladder, and urethra are normal but atrophic.  Bimanual and rectovaginal exam reveals no masses, induration, or  nodularity.  EXTREMITIES:  Lower extremities are without edema and varicosities.   IMPRESSION:  Stage IIIC endometrial cancer.  No evidence of recurrent  disease.   PLAN:  Pap smears are obtained.  Patient will return to see [place] in six  months for continuing surveillance.      De Blanch, M.D.  Electronically Signed     DC/MEDQ  D:  10/30/2006  T:  10/30/2006  Job:  [id number]  cc:   Telford Nab, R.N.  [address]  [place], [place] [address]   Rudy Jew. Ashley Royalty, M.D.  Fax: [phone]   Lenon Curt. Chilton Si, M.D.  Fax: [phone]

## 2011-03-09 ENCOUNTER — Other Ambulatory Visit: Payer: Self-pay | Admitting: Internal Medicine

## 2011-03-09 ENCOUNTER — Ambulatory Visit (HOSPITAL_COMMUNITY)
Admission: RE | Admit: 2011-03-09 | Discharge: 2011-03-09 | Disposition: A | Discharge status: Home / Self Care | Payer: Medicare Other | Source: Ambulatory Visit | Attending: Internal Medicine | Admitting: Internal Medicine

## 2011-03-09 ENCOUNTER — Encounter (HOSPITAL_BASED_OUTPATIENT_CLINIC_OR_DEPARTMENT_OTHER): Payer: Medicare Other | Admitting: Internal Medicine

## 2011-03-09 DIAGNOSIS — K8689 Other specified diseases of pancreas: Secondary | ICD-10-CM | POA: Insufficient documentation

## 2011-03-09 DIAGNOSIS — N281 Cyst of kidney, acquired: Secondary | ICD-10-CM | POA: Insufficient documentation

## 2011-03-09 DIAGNOSIS — C50519 Malignant neoplasm of lower-outer quadrant of unspecified female breast: Secondary | ICD-10-CM

## 2011-03-09 DIAGNOSIS — G609 Hereditary and idiopathic neuropathy, unspecified: Secondary | ICD-10-CM

## 2011-03-09 DIAGNOSIS — Z9089 Acquired absence of other organs: Secondary | ICD-10-CM | POA: Insufficient documentation

## 2011-03-09 DIAGNOSIS — Z9221 Personal history of antineoplastic chemotherapy: Secondary | ICD-10-CM | POA: Insufficient documentation

## 2011-03-09 DIAGNOSIS — Z923 Personal history of irradiation: Secondary | ICD-10-CM | POA: Insufficient documentation

## 2011-03-09 DIAGNOSIS — C3490 Malignant neoplasm of unspecified part of unspecified bronchus or lung: Secondary | ICD-10-CM | POA: Insufficient documentation

## 2011-03-09 DIAGNOSIS — K5730 Diverticulosis of large intestine without perforation or abscess without bleeding: Secondary | ICD-10-CM | POA: Insufficient documentation

## 2011-03-09 DIAGNOSIS — C50919 Malignant neoplasm of unspecified site of unspecified female breast: Secondary | ICD-10-CM | POA: Insufficient documentation

## 2011-03-09 DIAGNOSIS — M899 Disorder of bone, unspecified: Secondary | ICD-10-CM | POA: Insufficient documentation

## 2011-03-09 DIAGNOSIS — M949 Disorder of cartilage, unspecified: Secondary | ICD-10-CM | POA: Insufficient documentation

## 2011-03-09 DIAGNOSIS — C3410 Malignant neoplasm of upper lobe, unspecified bronchus or lung: Secondary | ICD-10-CM

## 2011-03-09 DIAGNOSIS — M47817 Spondylosis without myelopathy or radiculopathy, lumbosacral region: Secondary | ICD-10-CM | POA: Insufficient documentation

## 2011-03-09 DIAGNOSIS — Z90710 Acquired absence of both cervix and uterus: Secondary | ICD-10-CM | POA: Insufficient documentation

## 2011-03-09 LAB — CBC WITH DIFFERENTIAL/PLATELET
BASO%: 0.2 % (ref 0.0–2.0)
EOS%: 1.1 % (ref 0.0–7.0)
MCH: 31.2 pg (ref 25.1–34.0)
MCHC: 33.3 g/dL (ref 31.5–36.0)
RDW: 13 % (ref 11.2–14.5)
lymph#: 0.8 10*3/uL — ABNORMAL LOW (ref 0.9–3.3)

## 2011-03-09 LAB — CMP (CANCER CENTER ONLY)
AST: 29 U/L (ref 11–38)
Alkaline Phosphatase: 87 U/L — ABNORMAL HIGH (ref 26–84)
BUN, Bld: 11 mg/dL (ref 7–22)
Creat: 0.8 mg/dl (ref 0.6–1.2)

## 2011-03-09 MED ORDER — IOHEXOL 300 MG/ML  SOLN
100.0000 mL | Freq: Once | INTRAMUSCULAR | Status: AC | PRN
  Administered 2011-03-09: 100 mL via INTRAVENOUS

## 2011-03-13 ENCOUNTER — Encounter (HOSPITAL_BASED_OUTPATIENT_CLINIC_OR_DEPARTMENT_OTHER): Payer: Medicare Other | Admitting: Internal Medicine

## 2011-03-13 ENCOUNTER — Other Ambulatory Visit: Payer: Self-pay | Admitting: Internal Medicine

## 2011-03-13 DIAGNOSIS — R51 Headache: Secondary | ICD-10-CM

## 2011-03-13 DIAGNOSIS — Z8542 Personal history of malignant neoplasm of other parts of uterus: Secondary | ICD-10-CM

## 2011-03-13 DIAGNOSIS — Z85118 Personal history of other malignant neoplasm of bronchus and lung: Secondary | ICD-10-CM

## 2011-03-13 DIAGNOSIS — C799 Secondary malignant neoplasm of unspecified site: Secondary | ICD-10-CM

## 2011-03-13 DIAGNOSIS — Z170 Estrogen receptor positive status [ER+]: Secondary | ICD-10-CM

## 2011-03-13 DIAGNOSIS — C3410 Malignant neoplasm of upper lobe, unspecified bronchus or lung: Secondary | ICD-10-CM

## 2011-03-13 DIAGNOSIS — C3490 Malignant neoplasm of unspecified part of unspecified bronchus or lung: Secondary | ICD-10-CM

## 2011-03-13 DIAGNOSIS — H538 Other visual disturbances: Secondary | ICD-10-CM

## 2011-03-13 DIAGNOSIS — C50919 Malignant neoplasm of unspecified site of unspecified female breast: Secondary | ICD-10-CM

## 2011-03-13 DIAGNOSIS — C50519 Malignant neoplasm of lower-outer quadrant of unspecified female breast: Secondary | ICD-10-CM

## 2011-03-21 ENCOUNTER — Encounter (INDEPENDENT_AMBULATORY_CARE_PROVIDER_SITE_OTHER): Payer: Self-pay | Admitting: Surgery

## 2011-05-02 ENCOUNTER — Emergency Department (HOSPITAL_COMMUNITY)
Admission: EM | Admit: 2011-05-02 | Discharge: 2011-05-02 | Disposition: A | Discharge status: Home / Self Care | Payer: Medicare Other | Attending: Emergency Medicine | Admitting: Emergency Medicine

## 2011-05-02 ENCOUNTER — Emergency Department (HOSPITAL_COMMUNITY): Payer: Medicare Other

## 2011-05-02 DIAGNOSIS — M542 Cervicalgia: Secondary | ICD-10-CM | POA: Insufficient documentation

## 2011-05-02 DIAGNOSIS — Z853 Personal history of malignant neoplasm of breast: Secondary | ICD-10-CM | POA: Insufficient documentation

## 2011-05-02 DIAGNOSIS — M549 Dorsalgia, unspecified: Secondary | ICD-10-CM | POA: Insufficient documentation

## 2011-05-02 DIAGNOSIS — I10 Essential (primary) hypertension: Secondary | ICD-10-CM | POA: Insufficient documentation

## 2011-05-02 DIAGNOSIS — Z85118 Personal history of other malignant neoplasm of bronchus and lung: Secondary | ICD-10-CM | POA: Insufficient documentation

## 2011-05-02 DIAGNOSIS — Y92009 Unspecified place in unspecified non-institutional (private) residence as the place of occurrence of the external cause: Secondary | ICD-10-CM | POA: Insufficient documentation

## 2011-05-02 DIAGNOSIS — M47812 Spondylosis without myelopathy or radiculopathy, cervical region: Secondary | ICD-10-CM | POA: Insufficient documentation

## 2011-05-02 DIAGNOSIS — S0990XA Unspecified injury of head, initial encounter: Secondary | ICD-10-CM | POA: Insufficient documentation

## 2011-05-02 DIAGNOSIS — W010XXA Fall on same level from slipping, tripping and stumbling without subsequent striking against object, initial encounter: Secondary | ICD-10-CM | POA: Insufficient documentation

## 2011-05-02 DIAGNOSIS — M25559 Pain in unspecified hip: Secondary | ICD-10-CM | POA: Insufficient documentation

## 2011-05-15 ENCOUNTER — Emergency Department (HOSPITAL_COMMUNITY)
Admission: EM | Admit: 2011-05-15 | Discharge: 2011-05-16 | Disposition: A | Discharge status: Home / Self Care | Payer: Medicare Other | Attending: Emergency Medicine | Admitting: Emergency Medicine

## 2011-05-15 DIAGNOSIS — W010XXA Fall on same level from slipping, tripping and stumbling without subsequent striking against object, initial encounter: Secondary | ICD-10-CM | POA: Insufficient documentation

## 2011-05-15 DIAGNOSIS — M5030 Other cervical disc degeneration, unspecified cervical region: Secondary | ICD-10-CM | POA: Insufficient documentation

## 2011-05-15 DIAGNOSIS — Y9210 Unspecified residential institution as the place of occurrence of the external cause: Secondary | ICD-10-CM | POA: Insufficient documentation

## 2011-05-15 DIAGNOSIS — I10 Essential (primary) hypertension: Secondary | ICD-10-CM | POA: Insufficient documentation

## 2011-05-15 DIAGNOSIS — S1093XA Contusion of unspecified part of neck, initial encounter: Secondary | ICD-10-CM | POA: Insufficient documentation

## 2011-05-15 DIAGNOSIS — S0003XA Contusion of scalp, initial encounter: Secondary | ICD-10-CM | POA: Insufficient documentation

## 2011-05-15 DIAGNOSIS — Z85118 Personal history of other malignant neoplasm of bronchus and lung: Secondary | ICD-10-CM | POA: Insufficient documentation

## 2011-05-15 DIAGNOSIS — Z853 Personal history of malignant neoplasm of breast: Secondary | ICD-10-CM | POA: Insufficient documentation

## 2011-05-16 ENCOUNTER — Encounter (HOSPITAL_COMMUNITY): Payer: Self-pay

## 2011-05-16 ENCOUNTER — Emergency Department (HOSPITAL_COMMUNITY): Payer: Medicare Other

## 2011-05-16 LAB — URINALYSIS, ROUTINE W REFLEX MICROSCOPIC
Bilirubin Urine: NEGATIVE
Hgb urine dipstick: NEGATIVE
Nitrite: NEGATIVE
Specific Gravity, Urine: 1.027 (ref 1.005–1.030)
pH: 6.5 (ref 5.0–8.0)

## 2011-05-17 LAB — URINE CULTURE: Culture  Setup Time: 201208010544

## 2011-05-30 ENCOUNTER — Encounter (HOSPITAL_BASED_OUTPATIENT_CLINIC_OR_DEPARTMENT_OTHER): Payer: Self-pay | Admitting: *Deleted

## 2011-05-30 ENCOUNTER — Emergency Department (HOSPITAL_COMMUNITY)
Admission: EM | Admit: 2011-05-30 | Discharge: 2011-05-30 | Disposition: A | Discharge status: Home / Self Care | Payer: Medicare Other | Attending: Emergency Medicine | Admitting: Emergency Medicine

## 2011-05-30 ENCOUNTER — Emergency Department (HOSPITAL_COMMUNITY): Payer: Medicare Other

## 2011-05-30 ENCOUNTER — Emergency Department (INDEPENDENT_AMBULATORY_CARE_PROVIDER_SITE_OTHER): Payer: Medicare Other

## 2011-05-30 ENCOUNTER — Emergency Department (HOSPITAL_BASED_OUTPATIENT_CLINIC_OR_DEPARTMENT_OTHER)
Admission: EM | Admit: 2011-05-30 | Discharge: 2011-05-30 | Disposition: A | Discharge status: Other Acute Inpatient Hospital | Payer: Medicare Other | Attending: Emergency Medicine | Admitting: Emergency Medicine

## 2011-05-30 DIAGNOSIS — C3490 Malignant neoplasm of unspecified part of unspecified bronchus or lung: Secondary | ICD-10-CM | POA: Insufficient documentation

## 2011-05-30 DIAGNOSIS — F429 Obsessive-compulsive disorder, unspecified: Secondary | ICD-10-CM | POA: Insufficient documentation

## 2011-05-30 DIAGNOSIS — W19XXXA Unspecified fall, initial encounter: Secondary | ICD-10-CM | POA: Insufficient documentation

## 2011-05-30 DIAGNOSIS — M25559 Pain in unspecified hip: Secondary | ICD-10-CM | POA: Insufficient documentation

## 2011-05-30 DIAGNOSIS — F29 Unspecified psychosis not due to a substance or known physiological condition: Secondary | ICD-10-CM | POA: Insufficient documentation

## 2011-05-30 DIAGNOSIS — S8000XA Contusion of unspecified knee, initial encounter: Secondary | ICD-10-CM | POA: Insufficient documentation

## 2011-05-30 DIAGNOSIS — Z853 Personal history of malignant neoplasm of breast: Secondary | ICD-10-CM | POA: Insufficient documentation

## 2011-05-30 DIAGNOSIS — I10 Essential (primary) hypertension: Secondary | ICD-10-CM | POA: Insufficient documentation

## 2011-05-30 DIAGNOSIS — M25569 Pain in unspecified knee: Secondary | ICD-10-CM

## 2011-05-30 DIAGNOSIS — Z043 Encounter for examination and observation following other accident: Secondary | ICD-10-CM | POA: Insufficient documentation

## 2011-05-30 DIAGNOSIS — Z9181 History of falling: Secondary | ICD-10-CM | POA: Insufficient documentation

## 2011-05-30 DIAGNOSIS — S8002XA Contusion of left knee, initial encounter: Secondary | ICD-10-CM

## 2011-05-30 DIAGNOSIS — F0390 Unspecified dementia without behavioral disturbance: Secondary | ICD-10-CM | POA: Insufficient documentation

## 2011-05-30 DIAGNOSIS — F068 Other specified mental disorders due to known physiological condition: Secondary | ICD-10-CM | POA: Insufficient documentation

## 2011-05-30 DIAGNOSIS — Y9210 Unspecified residential institution as the place of occurrence of the external cause: Secondary | ICD-10-CM | POA: Insufficient documentation

## 2011-05-30 HISTORY — DX: Unspecified dementia, unspecified severity, without behavioral disturbance, psychotic disturbance, mood disturbance, and anxiety: F03.90

## 2011-05-30 LAB — URINALYSIS, ROUTINE W REFLEX MICROSCOPIC
Bilirubin Urine: NEGATIVE
Bilirubin Urine: NEGATIVE
Glucose, UA: NEGATIVE mg/dL
Hgb urine dipstick: NEGATIVE
Hgb urine dipstick: NEGATIVE
Ketones, ur: NEGATIVE mg/dL
Nitrite: NEGATIVE
Specific Gravity, Urine: 1.024 (ref 1.005–1.030)
Urobilinogen, UA: 0.2 mg/dL (ref 0.0–1.0)
pH: 6.5 (ref 5.0–8.0)
pH: 7 (ref 5.0–8.0)

## 2011-05-30 NOTE — ED Provider Notes (Addendum)
History     CSN: [id number] Arrival date & time: 05/30/2011  6:41 AM  Chief Complaint  Patient presents with  . Fall  . Hip Pain   HPI Comments: Deneis fever, vomiting, cough, diarrhea, dysuria, sob, cp, abd pain, back or neck pain.  Staff found her sitting on the edge of the bed on their entrnce into the room.  Patient is a 77 y.o. female presenting with fall and hip pain. The history is provided by the patient.  Fall The accident occurred less than 1 hour ago. The fall occurred while walking (at [facility] - had fall while walking to bathroom - doesn't remember how she fell and states she doesn't hurt anywhere). Distance fallen: from standing. She landed on a hard floor. Point of impact: unknown. Pain location: L thigh. The pain is mild. She was ambulatory at the scene (she got herself back on the bed by herself). There was no entrapment after the fall. Pertinent negatives include no fever, no numbness, no abdominal pain, no nausea, no vomiting, no headaches and no loss of consciousness. Exacerbated by: ROM of the L leg. Prehospitalization: EMS transported without immobilization. She has tried nothing for the symptoms.  Hip Pain Pertinent negatives include no abdominal pain and no headaches.    Past Medical History  Diagnosis Date  . Cancer   . Hypertension   . Dementia     Past Surgical History  Procedure Date  . Breast lumpectomy   . Lung cancer surgery   . Uterine cancer     History reviewed. No pertinent family history.  History  Substance Use Topics  . Smoking status: Unknown If Ever Smoked  . Smokeless tobacco: Not on file  . Alcohol Use: Yes     GLASS RED WINE    OB History    Grav Para Term Preterm Abortions TAB SAB Ect Mult Living                  Review of Systems  Constitutional: Negative for fever.  Gastrointestinal: Negative for nausea, vomiting and abdominal pain.  Neurological: Negative for loss of consciousness, numbness and headaches.  All other systems  reviewed and are negative.    Physical Exam  BP 132/75  Pulse 69  Temp(Src) 97.9 F (36.6 C) (Oral)  Resp 18  SpO2 100%  Physical Exam  Nursing note and vitals reviewed. Constitutional: She appears well-developed and well-nourished. No distress.  HENT:  Head: Normocephalic and atraumatic.  Mouth/Throat: Oropharynx is clear and moist. No oropharyngeal exudate.  Eyes: Conjunctivae and EOM are normal. Pupils are equal, round, and reactive to light. Right eye exhibits no discharge. Left eye exhibits no discharge. No scleral icterus.  Neck: Normal range of motion. Neck supple. No JVD present. No thyromegaly present.  Cardiovascular: Normal rate, regular rhythm, normal heart sounds and intact distal pulses.  Exam reveals no gallop and no friction rub.   No murmur heard. Pulmonary/Chest: Effort normal and breath sounds normal. No respiratory distress. She has no wheezes. She has no rales.  Abdominal: Soft. Bowel sounds are normal. She exhibits no distension and no mass. There is no tenderness.  Genitourinary:       Pungent urine odor - adult diaper in place  Musculoskeletal: Normal range of motion. She exhibits no edema and no tenderness.       No spinal ttp, mild pain with ROM of the L thigh but points to distal thigh and has contusion over the L knee (fading echymoses with yellow  hue)  Lymphadenopathy:    She has no cervical adenopathy.  Neurological: She is alert. Coordination normal.  Skin: Skin is warm and dry. No rash noted. No erythema.  Psychiatric: She has a normal mood and affect. Her behavior is normal.    ED Course  Procedures  MDM Pt has normal VS but has foul smelling urine - has bruising around the L knee but no pain related to the L hip - states she falls intermittently - will image the L knee, UA.  Urinalysis normal without signs of infection or dehydration.  X-rays evaluated by myself and the radiologist and revealed no acute fractures. 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 We'll discharge patient back to nursing facility.  Vida Roller, MD 05/30/11 1610  Vida Roller, MD 05/30/11 [phone]

## 2011-05-30 NOTE — ED Notes (Signed)
Pt presents to ED today s/p fall out of bed in nursing home.  Pt has no obvious deformities to lower extremities.  Pt has old skin tear noted to left elbow

## 2011-05-30 NOTE — ED Notes (Signed)
Pt presents to ED this am via [facility] for fall out of bed at nursing home.  Per EMS, pt reporting L hip pain, no obvious deformity or rotation noted.  Pt is a&o per normal.

## 2011-05-30 NOTE — ED Notes (Signed)
In and out cath used on patient to obtain urine specimen. Patient incontinent of urine. Brief full of urine on arrival to ED. Patient cathes, cleaned, and new brief placed. No skin breakdown noted.

## 2011-06-02 ENCOUNTER — Emergency Department (HOSPITAL_COMMUNITY)
Admission: EM | Admit: 2011-06-02 | Discharge: 2011-06-02 | Disposition: A | Discharge status: Home / Self Care | Payer: Medicare Other | Attending: Emergency Medicine | Admitting: Emergency Medicine

## 2011-06-02 DIAGNOSIS — R413 Other amnesia: Secondary | ICD-10-CM | POA: Insufficient documentation

## 2011-06-02 DIAGNOSIS — Z85118 Personal history of other malignant neoplasm of bronchus and lung: Secondary | ICD-10-CM | POA: Insufficient documentation

## 2011-06-02 DIAGNOSIS — I10 Essential (primary) hypertension: Secondary | ICD-10-CM | POA: Insufficient documentation

## 2011-06-02 DIAGNOSIS — Z79899 Other long term (current) drug therapy: Secondary | ICD-10-CM | POA: Insufficient documentation

## 2011-06-02 DIAGNOSIS — Y9210 Unspecified residential institution as the place of occurrence of the external cause: Secondary | ICD-10-CM | POA: Insufficient documentation

## 2011-06-02 DIAGNOSIS — Z853 Personal history of malignant neoplasm of breast: Secondary | ICD-10-CM | POA: Insufficient documentation

## 2011-06-02 DIAGNOSIS — W19XXXA Unspecified fall, initial encounter: Secondary | ICD-10-CM | POA: Insufficient documentation

## 2011-06-02 DIAGNOSIS — T1490XA Injury, unspecified, initial encounter: Secondary | ICD-10-CM | POA: Insufficient documentation

## 2011-06-02 LAB — URINALYSIS, ROUTINE W REFLEX MICROSCOPIC
Glucose, UA: NEGATIVE mg/dL
Hgb urine dipstick: NEGATIVE
Leukocytes, UA: NEGATIVE
Specific Gravity, Urine: 1.021 (ref 1.005–1.030)
pH: 7.5 (ref 5.0–8.0)

## 2011-06-02 LAB — CBC
Hemoglobin: 12.9 g/dL (ref 12.0–15.0)
MCH: 31.7 pg (ref 26.0–34.0)
MCV: 94.1 fL (ref 78.0–100.0)
RBC: 4.07 MIL/uL (ref 3.87–5.11)
WBC: 7.6 10*3/uL (ref 4.0–10.5)

## 2011-06-02 LAB — BASIC METABOLIC PANEL
BUN: 19 mg/dL (ref 6–23)
CO2: 26 mEq/L (ref 19–32)
Chloride: 110 mEq/L (ref 96–112)
GFR calc Af Amer: >60 mL/min (ref >60)
GFR calc non Af Amer: 60 mL/min — ABNORMAL LOW (ref >60)

## 2011-06-02 LAB — DIFFERENTIAL
Lymphocytes Relative: 18 % (ref 12–46)
Lymphs Abs: 1.4 10*3/uL (ref 0.7–4.0)
Monocytes Relative: 9 % (ref 3–12)
Neutro Abs: 5.5 10*3/uL (ref 1.7–7.7)
Neutrophils Relative %: 72 % (ref 43–77)

## 2011-06-08 ENCOUNTER — Emergency Department (HOSPITAL_COMMUNITY): Payer: Medicare Other

## 2011-06-08 ENCOUNTER — Emergency Department (HOSPITAL_COMMUNITY)
Admission: EM | Admit: 2011-06-08 | Discharge: 2011-06-08 | Disposition: A | Discharge status: Home / Self Care | Payer: Medicare Other | Attending: Emergency Medicine | Admitting: Emergency Medicine

## 2011-06-08 ENCOUNTER — Encounter (HOSPITAL_COMMUNITY): Payer: Self-pay

## 2011-06-08 DIAGNOSIS — F411 Generalized anxiety disorder: Secondary | ICD-10-CM | POA: Insufficient documentation

## 2011-06-08 DIAGNOSIS — F29 Unspecified psychosis not due to a substance or known physiological condition: Secondary | ICD-10-CM | POA: Insufficient documentation

## 2011-06-08 DIAGNOSIS — Z85118 Personal history of other malignant neoplasm of bronchus and lung: Secondary | ICD-10-CM | POA: Insufficient documentation

## 2011-06-08 DIAGNOSIS — I10 Essential (primary) hypertension: Secondary | ICD-10-CM | POA: Insufficient documentation

## 2011-06-08 DIAGNOSIS — Y9210 Unspecified residential institution as the place of occurrence of the external cause: Secondary | ICD-10-CM | POA: Insufficient documentation

## 2011-06-08 DIAGNOSIS — Z853 Personal history of malignant neoplasm of breast: Secondary | ICD-10-CM | POA: Insufficient documentation

## 2011-06-08 DIAGNOSIS — T1490XA Injury, unspecified, initial encounter: Secondary | ICD-10-CM | POA: Insufficient documentation

## 2011-06-08 DIAGNOSIS — W19XXXA Unspecified fall, initial encounter: Secondary | ICD-10-CM | POA: Insufficient documentation

## 2011-06-08 LAB — URINALYSIS, ROUTINE W REFLEX MICROSCOPIC
Bilirubin Urine: NEGATIVE
Ketones, ur: NEGATIVE mg/dL
Nitrite: NEGATIVE
Specific Gravity, Urine: 1.027 (ref 1.005–1.030)
Urobilinogen, UA: 0.2 mg/dL (ref 0.0–1.0)

## 2011-06-08 LAB — CBC
HCT: 33.8 % — ABNORMAL LOW (ref 36.0–46.0)
Hemoglobin: 11.5 g/dL — ABNORMAL LOW (ref 12.0–15.0)
MCHC: 34 g/dL (ref 30.0–36.0)
MCV: 94.7 fL (ref 78.0–100.0)

## 2011-06-08 LAB — DIFFERENTIAL
Basophils Absolute: 0.1 10*3/uL (ref 0.0–0.1)
Eosinophils Absolute: 0.1 10*3/uL (ref 0.0–0.7)
Lymphocytes Relative: 24 % (ref 12–46)
Lymphs Abs: 1.6 10*3/uL (ref 0.7–4.0)
Neutro Abs: 4.2 10*3/uL (ref 1.7–7.7)

## 2011-06-08 LAB — COMPREHENSIVE METABOLIC PANEL
Alkaline Phosphatase: 68 U/L (ref 39–117)
BUN: 16 mg/dL (ref 6–23)
Chloride: 106 mEq/L (ref 96–112)
GFR calc Af Amer: >60 mL/min (ref >60)
Glucose, Bld: 98 mg/dL (ref 70–99)
Potassium: 3.2 mEq/L — ABNORMAL LOW (ref 3.5–5.1)
Total Bilirubin: 0.2 mg/dL — ABNORMAL LOW (ref 0.3–1.2)
Total Protein: 5.6 g/dL — ABNORMAL LOW (ref 6.0–8.3)

## 2011-06-15 ENCOUNTER — Emergency Department (HOSPITAL_COMMUNITY): Payer: Medicare Other

## 2011-06-15 ENCOUNTER — Emergency Department (HOSPITAL_COMMUNITY)
Admission: EM | Admit: 2011-06-15 | Discharge: 2011-06-15 | Disposition: A | Discharge status: Home / Self Care | Payer: Medicare Other | Attending: Emergency Medicine | Admitting: Emergency Medicine

## 2011-06-15 ENCOUNTER — Encounter (HOSPITAL_COMMUNITY): Payer: Self-pay

## 2011-06-15 DIAGNOSIS — S0003XA Contusion of scalp, initial encounter: Secondary | ICD-10-CM | POA: Insufficient documentation

## 2011-06-15 DIAGNOSIS — J4489 Other specified chronic obstructive pulmonary disease: Secondary | ICD-10-CM | POA: Insufficient documentation

## 2011-06-15 DIAGNOSIS — Y9210 Unspecified residential institution as the place of occurrence of the external cause: Secondary | ICD-10-CM | POA: Insufficient documentation

## 2011-06-15 DIAGNOSIS — Z9181 History of falling: Secondary | ICD-10-CM | POA: Insufficient documentation

## 2011-06-15 DIAGNOSIS — J449 Chronic obstructive pulmonary disease, unspecified: Secondary | ICD-10-CM | POA: Insufficient documentation

## 2011-06-15 DIAGNOSIS — S1093XA Contusion of unspecified part of neck, initial encounter: Secondary | ICD-10-CM | POA: Insufficient documentation

## 2011-06-15 DIAGNOSIS — Z853 Personal history of malignant neoplasm of breast: Secondary | ICD-10-CM | POA: Insufficient documentation

## 2011-06-15 DIAGNOSIS — I10 Essential (primary) hypertension: Secondary | ICD-10-CM | POA: Insufficient documentation

## 2011-06-15 DIAGNOSIS — W010XXA Fall on same level from slipping, tripping and stumbling without subsequent striking against object, initial encounter: Secondary | ICD-10-CM | POA: Insufficient documentation

## 2011-06-15 DIAGNOSIS — G609 Hereditary and idiopathic neuropathy, unspecified: Secondary | ICD-10-CM | POA: Insufficient documentation

## 2011-06-15 DIAGNOSIS — Z85118 Personal history of other malignant neoplasm of bronchus and lung: Secondary | ICD-10-CM | POA: Insufficient documentation

## 2011-06-15 DIAGNOSIS — S0990XA Unspecified injury of head, initial encounter: Secondary | ICD-10-CM | POA: Insufficient documentation

## 2011-06-15 HISTORY — DX: Polyneuropathy, unspecified: G62.9

## 2011-06-15 HISTORY — DX: Chronic obstructive pulmonary disease, unspecified: J44.9

## 2011-06-21 ENCOUNTER — Emergency Department (HOSPITAL_COMMUNITY)
Admission: EM | Admit: 2011-06-21 | Discharge: 2011-06-21 | Disposition: A | Discharge status: Home / Self Care | Payer: Medicare Other | Attending: Emergency Medicine | Admitting: Emergency Medicine

## 2011-06-21 ENCOUNTER — Emergency Department (HOSPITAL_COMMUNITY): Payer: Medicare Other

## 2011-06-21 ENCOUNTER — Encounter (HOSPITAL_COMMUNITY): Payer: Self-pay | Admitting: Radiology

## 2011-06-21 DIAGNOSIS — W010XXA Fall on same level from slipping, tripping and stumbling without subsequent striking against object, initial encounter: Secondary | ICD-10-CM | POA: Insufficient documentation

## 2011-06-21 DIAGNOSIS — F0390 Unspecified dementia without behavioral disturbance: Secondary | ICD-10-CM | POA: Insufficient documentation

## 2011-06-21 DIAGNOSIS — R5381 Other malaise: Secondary | ICD-10-CM | POA: Insufficient documentation

## 2011-06-21 DIAGNOSIS — S0990XA Unspecified injury of head, initial encounter: Secondary | ICD-10-CM | POA: Insufficient documentation

## 2011-06-21 DIAGNOSIS — R5383 Other fatigue: Secondary | ICD-10-CM | POA: Insufficient documentation

## 2011-06-21 DIAGNOSIS — Z853 Personal history of malignant neoplasm of breast: Secondary | ICD-10-CM | POA: Insufficient documentation

## 2011-06-21 DIAGNOSIS — F411 Generalized anxiety disorder: Secondary | ICD-10-CM | POA: Insufficient documentation

## 2011-06-21 DIAGNOSIS — J4489 Other specified chronic obstructive pulmonary disease: Secondary | ICD-10-CM | POA: Insufficient documentation

## 2011-06-21 DIAGNOSIS — E860 Dehydration: Secondary | ICD-10-CM | POA: Insufficient documentation

## 2011-06-21 DIAGNOSIS — Y9210 Unspecified residential institution as the place of occurrence of the external cause: Secondary | ICD-10-CM | POA: Insufficient documentation

## 2011-06-21 DIAGNOSIS — Z85118 Personal history of other malignant neoplasm of bronchus and lung: Secondary | ICD-10-CM | POA: Insufficient documentation

## 2011-06-21 DIAGNOSIS — J449 Chronic obstructive pulmonary disease, unspecified: Secondary | ICD-10-CM | POA: Insufficient documentation

## 2011-06-21 LAB — URINALYSIS, ROUTINE W REFLEX MICROSCOPIC
Glucose, UA: NEGATIVE mg/dL
Hgb urine dipstick: NEGATIVE
Leukocytes, UA: NEGATIVE
Specific Gravity, Urine: 1.019 (ref 1.005–1.030)

## 2011-06-21 LAB — PROTIME-INR
INR: 2.54 — ABNORMAL HIGH (ref 0.00–1.49)
Prothrombin Time: 27.8 seconds — ABNORMAL HIGH (ref 11.6–15.2)

## 2011-06-21 LAB — CBC
Hemoglobin: 10.9 g/dL — ABNORMAL LOW (ref 12.0–15.0)
MCH: 31.6 pg (ref 26.0–34.0)
MCV: 95.9 fL (ref 78.0–100.0)
RBC: 3.45 MIL/uL — ABNORMAL LOW (ref 3.87–5.11)
WBC: 7.9 10*3/uL (ref 4.0–10.5)

## 2011-06-21 LAB — BASIC METABOLIC PANEL
Calcium: 8.6 mg/dL (ref 8.4–10.5)
GFR calc non Af Amer: 52 mL/min — ABNORMAL LOW (ref >60)
Potassium: 4.1 mEq/L (ref 3.5–5.1)
Sodium: 141 mEq/L (ref 135–145)

## 2011-06-21 LAB — DIFFERENTIAL
Basophils Relative: 0 % (ref 0–1)
Lymphs Abs: 1.3 10*3/uL (ref 0.7–4.0)
Monocytes Relative: 9 % (ref 3–12)
Neutro Abs: 5.7 10*3/uL (ref 1.7–7.7)
Neutrophils Relative %: 73 % (ref 43–77)

## 2011-06-21 LAB — APTT: aPTT: 36 seconds (ref 24–37)

## 2011-06-22 ENCOUNTER — Emergency Department (HOSPITAL_COMMUNITY): Payer: Medicare Other

## 2011-06-22 ENCOUNTER — Emergency Department (HOSPITAL_COMMUNITY)
Admission: EM | Admit: 2011-06-22 | Discharge: 2011-06-22 | Disposition: A | Discharge status: Home / Self Care | Payer: Medicare Other | Attending: Emergency Medicine | Admitting: Emergency Medicine

## 2011-06-22 DIAGNOSIS — M25539 Pain in unspecified wrist: Secondary | ICD-10-CM | POA: Insufficient documentation

## 2011-06-22 DIAGNOSIS — J4489 Other specified chronic obstructive pulmonary disease: Secondary | ICD-10-CM | POA: Insufficient documentation

## 2011-06-22 DIAGNOSIS — M7989 Other specified soft tissue disorders: Secondary | ICD-10-CM | POA: Insufficient documentation

## 2011-06-22 DIAGNOSIS — Z853 Personal history of malignant neoplasm of breast: Secondary | ICD-10-CM | POA: Insufficient documentation

## 2011-06-22 DIAGNOSIS — M79609 Pain in unspecified limb: Secondary | ICD-10-CM | POA: Insufficient documentation

## 2011-06-22 DIAGNOSIS — J449 Chronic obstructive pulmonary disease, unspecified: Secondary | ICD-10-CM | POA: Insufficient documentation

## 2011-06-22 DIAGNOSIS — S5010XA Contusion of unspecified forearm, initial encounter: Secondary | ICD-10-CM | POA: Insufficient documentation

## 2011-06-22 DIAGNOSIS — Z79899 Other long term (current) drug therapy: Secondary | ICD-10-CM | POA: Insufficient documentation

## 2011-06-22 DIAGNOSIS — Y9210 Unspecified residential institution as the place of occurrence of the external cause: Secondary | ICD-10-CM | POA: Insufficient documentation

## 2011-06-22 DIAGNOSIS — F411 Generalized anxiety disorder: Secondary | ICD-10-CM | POA: Insufficient documentation

## 2011-06-22 DIAGNOSIS — Z85118 Personal history of other malignant neoplasm of bronchus and lung: Secondary | ICD-10-CM | POA: Insufficient documentation

## 2011-06-22 DIAGNOSIS — Z7982 Long term (current) use of aspirin: Secondary | ICD-10-CM | POA: Insufficient documentation

## 2011-06-22 DIAGNOSIS — W06XXXA Fall from bed, initial encounter: Secondary | ICD-10-CM | POA: Insufficient documentation

## 2011-06-27 ENCOUNTER — Emergency Department (HOSPITAL_COMMUNITY)
Admission: EM | Admit: 2011-06-27 | Discharge: 2011-06-27 | Disposition: A | Discharge status: Home / Self Care | Payer: Medicare Other | Attending: Emergency Medicine | Admitting: Emergency Medicine

## 2011-06-27 DIAGNOSIS — G609 Hereditary and idiopathic neuropathy, unspecified: Secondary | ICD-10-CM | POA: Insufficient documentation

## 2011-06-27 DIAGNOSIS — Z85118 Personal history of other malignant neoplasm of bronchus and lung: Secondary | ICD-10-CM | POA: Insufficient documentation

## 2011-06-27 DIAGNOSIS — N390 Urinary tract infection, site not specified: Secondary | ICD-10-CM | POA: Insufficient documentation

## 2011-06-27 DIAGNOSIS — W010XXA Fall on same level from slipping, tripping and stumbling without subsequent striking against object, initial encounter: Secondary | ICD-10-CM | POA: Insufficient documentation

## 2011-06-27 DIAGNOSIS — S5010XA Contusion of unspecified forearm, initial encounter: Secondary | ICD-10-CM | POA: Insufficient documentation

## 2011-06-27 DIAGNOSIS — Y9210 Unspecified residential institution as the place of occurrence of the external cause: Secondary | ICD-10-CM | POA: Insufficient documentation

## 2011-06-27 DIAGNOSIS — Z853 Personal history of malignant neoplasm of breast: Secondary | ICD-10-CM | POA: Insufficient documentation

## 2011-06-27 DIAGNOSIS — Z79899 Other long term (current) drug therapy: Secondary | ICD-10-CM | POA: Insufficient documentation

## 2011-06-27 DIAGNOSIS — I10 Essential (primary) hypertension: Secondary | ICD-10-CM | POA: Insufficient documentation

## 2011-06-27 DIAGNOSIS — T1490XA Injury, unspecified, initial encounter: Secondary | ICD-10-CM | POA: Insufficient documentation

## 2011-06-27 LAB — URINALYSIS, ROUTINE W REFLEX MICROSCOPIC
Bilirubin Urine: NEGATIVE
Glucose, UA: NEGATIVE mg/dL
Ketones, ur: NEGATIVE mg/dL
Protein, ur: NEGATIVE mg/dL

## 2011-06-27 LAB — URINE MICROSCOPIC-ADD ON

## 2011-06-29 ENCOUNTER — Emergency Department (HOSPITAL_COMMUNITY): Payer: Medicare Other

## 2011-06-29 ENCOUNTER — Emergency Department (HOSPITAL_COMMUNITY)
Admission: EM | Admit: 2011-06-29 | Discharge: 2011-06-30 | Disposition: A | Discharge status: Home / Self Care | Payer: Medicare Other | Attending: Emergency Medicine | Admitting: Emergency Medicine

## 2011-06-29 DIAGNOSIS — Y9210 Unspecified residential institution as the place of occurrence of the external cause: Secondary | ICD-10-CM | POA: Insufficient documentation

## 2011-06-29 DIAGNOSIS — W19XXXA Unspecified fall, initial encounter: Secondary | ICD-10-CM | POA: Insufficient documentation

## 2011-06-29 DIAGNOSIS — G319 Degenerative disease of nervous system, unspecified: Secondary | ICD-10-CM | POA: Insufficient documentation

## 2011-06-29 DIAGNOSIS — M25569 Pain in unspecified knee: Secondary | ICD-10-CM | POA: Insufficient documentation

## 2011-06-29 DIAGNOSIS — Z7901 Long term (current) use of anticoagulants: Secondary | ICD-10-CM | POA: Insufficient documentation

## 2011-06-29 DIAGNOSIS — T1490XA Injury, unspecified, initial encounter: Secondary | ICD-10-CM | POA: Insufficient documentation

## 2011-06-29 DIAGNOSIS — I498 Other specified cardiac arrhythmias: Secondary | ICD-10-CM | POA: Insufficient documentation

## 2011-06-29 DIAGNOSIS — Z853 Personal history of malignant neoplasm of breast: Secondary | ICD-10-CM | POA: Insufficient documentation

## 2011-06-29 DIAGNOSIS — Z85118 Personal history of other malignant neoplasm of bronchus and lung: Secondary | ICD-10-CM | POA: Insufficient documentation

## 2011-06-29 LAB — URINE CULTURE
Colony Count: >=100000
Culture  Setup Time: 201209120922

## 2011-06-29 LAB — POCT I-STAT, CHEM 8
BUN: 22 mg/dL (ref 6–23)
Calcium, Ion: 1.17 mmol/L (ref 1.12–1.32)
Creatinine, Ser: 0.9 mg/dL (ref 0.50–1.10)
Glucose, Bld: 104 mg/dL — ABNORMAL HIGH (ref 70–99)
Sodium: 140 mEq/L (ref 135–145)
TCO2: 20 mmol/L (ref 0–100)

## 2011-06-30 ENCOUNTER — Emergency Department (HOSPITAL_COMMUNITY): Payer: Medicare Other

## 2011-06-30 LAB — URINALYSIS, ROUTINE W REFLEX MICROSCOPIC
Glucose, UA: NEGATIVE mg/dL
Hgb urine dipstick: NEGATIVE
Ketones, ur: NEGATIVE mg/dL
Leukocytes, UA: NEGATIVE
pH: 6 (ref 5.0–8.0)

## 2011-07-06 LAB — CULTURE, BLOOD (ROUTINE X 2): Culture: NO GROWTH

## 2011-07-06 LAB — DIFFERENTIAL
Basophils Absolute: 0
Basophils Absolute: 0
Basophils Relative: 0
Eosinophils Absolute: 0.1
Eosinophils Absolute: 0.1
Eosinophils Absolute: 0.1
Lymphocytes Relative: 10 — ABNORMAL LOW
Lymphs Abs: 0.8
Monocytes Absolute: 0.5
Monocytes Absolute: 0.6
Monocytes Relative: 19 — ABNORMAL HIGH
Monocytes Relative: 7
Neutro Abs: 10.2 — ABNORMAL HIGH
Neutrophils Relative %: 2 — ABNORMAL LOW
Neutrophils Relative %: 82 — ABNORMAL HIGH

## 2011-07-06 LAB — CROSSMATCH: ABO/RH(D): O POS

## 2011-07-06 LAB — BASIC METABOLIC PANEL
BUN: 9
CO2: 25
Calcium: 8.2 — ABNORMAL LOW
Creatinine, Ser: 0.65
GFR calc Af Amer: >60

## 2011-07-06 LAB — COMPREHENSIVE METABOLIC PANEL
AST: 24
Albumin: 2.5 — ABNORMAL LOW
Alkaline Phosphatase: 89
BUN: 11
BUN: 9
Calcium: 8.3 — ABNORMAL LOW
Chloride: 102
Creatinine, Ser: 0.61
GFR calc Af Amer: >60
GFR calc non Af Amer: >60
Glucose, Bld: 100 — ABNORMAL HIGH
Potassium: 4.3
Total Bilirubin: 0.8
Total Protein: 5.3 — ABNORMAL LOW

## 2011-07-06 LAB — CBC
HCT: 26.9 — ABNORMAL LOW
HCT: 30.3 — ABNORMAL LOW
Hemoglobin: 9.4 — ABNORMAL LOW
MCHC: 34.9
MCHC: 35
MCV: 90
Platelets: 55 — ABNORMAL LOW
Platelets: 66 — ABNORMAL LOW
RBC: 2.54 — ABNORMAL LOW
RDW: 23.8 — ABNORMAL HIGH
RDW: 24.4 — ABNORMAL HIGH

## 2011-07-06 LAB — URINALYSIS, ROUTINE W REFLEX MICROSCOPIC
Bilirubin Urine: NEGATIVE
Nitrite: NEGATIVE
Specific Gravity, Urine: 1.017
Urobilinogen, UA: 0.2

## 2011-07-06 LAB — URINE CULTURE: Culture: NO GROWTH

## 2011-07-06 LAB — URINE MICROSCOPIC-ADD ON

## 2011-07-06 LAB — PATHOLOGIST SMEAR REVIEW

## 2011-07-25 LAB — HEPATIC FUNCTION PANEL
Alkaline Phosphatase: 101
Bilirubin, Direct: 0.1
Indirect Bilirubin: 0.2 — ABNORMAL LOW
Total Bilirubin: 0.3

## 2011-07-25 LAB — CULTURE, RESPIRATORY W GRAM STAIN

## 2011-07-25 LAB — FUNGUS CULTURE W SMEAR: Fungal Smear: NONE SEEN

## 2011-07-25 LAB — BASIC METABOLIC PANEL
BUN: 12
CO2: 24
Calcium: 8.9
Chloride: 101
Creatinine, Ser: 0.57
GFR calc Af Amer: >60

## 2011-07-25 LAB — CBC
MCHC: 32.3
MCV: 78.6
Platelets: 464 — ABNORMAL HIGH
RBC: 4.84
RDW: 18 — ABNORMAL HIGH

## 2011-07-25 LAB — AFB CULTURE WITH SMEAR (NOT AT ARMC)

## 2011-07-25 LAB — PROTIME-INR
INR: 1
Prothrombin Time: 13.2

## 2011-07-25 LAB — TYPE AND SCREEN
ABO/RH(D): O POS
Antibody Screen: NEGATIVE

## 2011-08-22 ENCOUNTER — Other Ambulatory Visit: Payer: Medicare Other

## 2011-08-22 ENCOUNTER — Telehealth: Payer: Self-pay | Admitting: Internal Medicine

## 2011-08-22 ENCOUNTER — Inpatient Hospital Stay (HOSPITAL_COMMUNITY): Admission: RE | Admit: 2011-08-22 | Payer: Medicare Other | Source: Ambulatory Visit

## 2011-08-22 ENCOUNTER — Other Ambulatory Visit (HOSPITAL_COMMUNITY): Payer: Medicare Other

## 2011-08-22 NOTE — Telephone Encounter (Signed)
Spoke to [employer]- Patient has moved to [facility] in [place], [place] closer to her son Bishop Dublin . He said she needs an oncologist in that area and he asked the social worker , Hulan Amato at the facility to call us. I have not heard from [place]. I will refer her to [facility] [phone]) if ok with Dr. Donnald Garre and call Leia Alf ( [phone] with information.

## 2011-08-22 NOTE — Telephone Encounter (Signed)
Ok with the referral

## 2011-08-23 NOTE — Telephone Encounter (Signed)
Spoke to [place] and patient has primary care in [place] and the primary care will take care of heme onc referral

## 2012-04-15 IMAGING — CT CT HEAD W/O CM
1 of 2 series · 16 of 30 positions shown, 20 images · non-contrast
Comparison: 06/15/2011.

CLINICAL DATA: Occipital pain status post fall. History of breast
cancer and lung cancer.  History of hypertension and dementia.

CT HEAD WITHOUT CONTRAST
TECHNIQUE: Contiguous axial images were obtained from the base of
the skull through the vertex without contrast.

[Series 3: head trauma 2.4 h60s · axial · 0.48mm/px · z∈[+1141,+1298]mm · 16 of 72 slices shown, 20 images]
[im 4/72  brain]
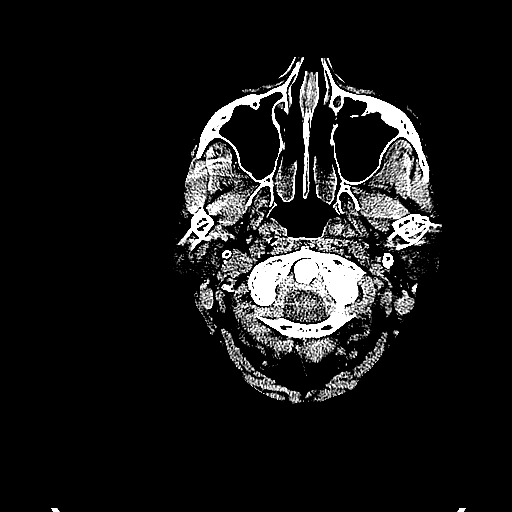
[im 4/72  bone]
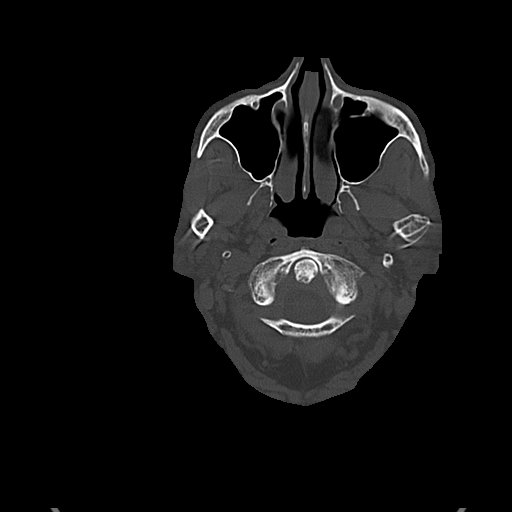
[im 8/72  brain]
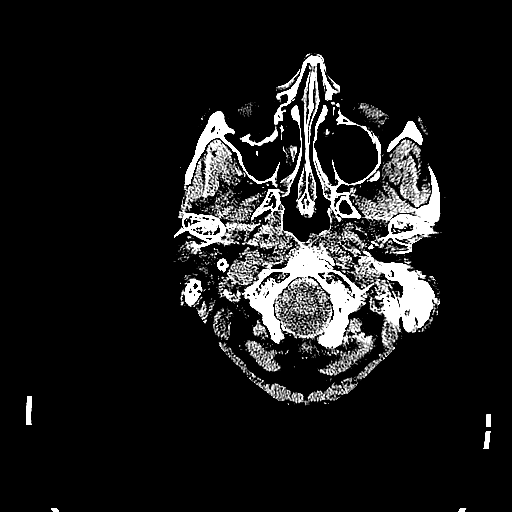
[im 12/72  brain]
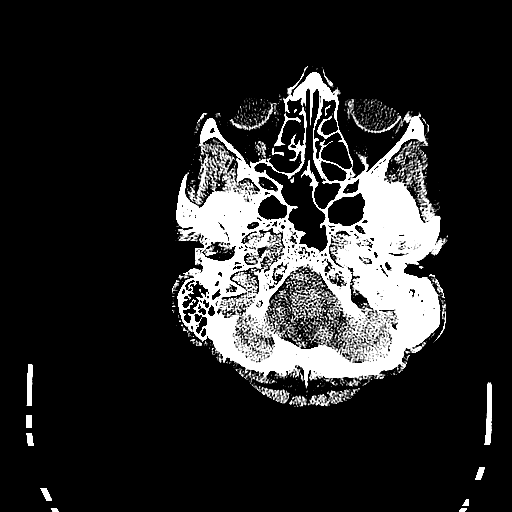
[im 15/72  brain]
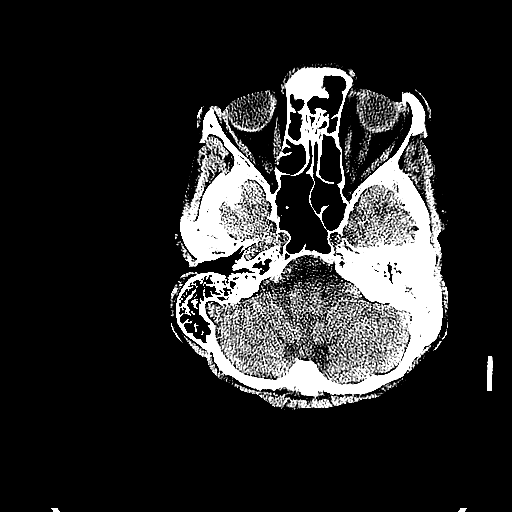
[im 23/72  brain]
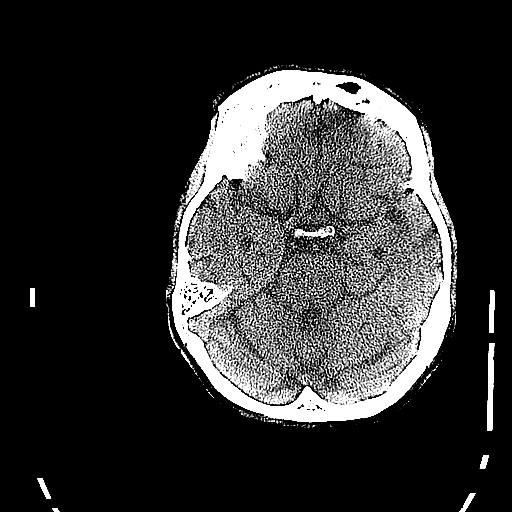
[im 23/72  bone]
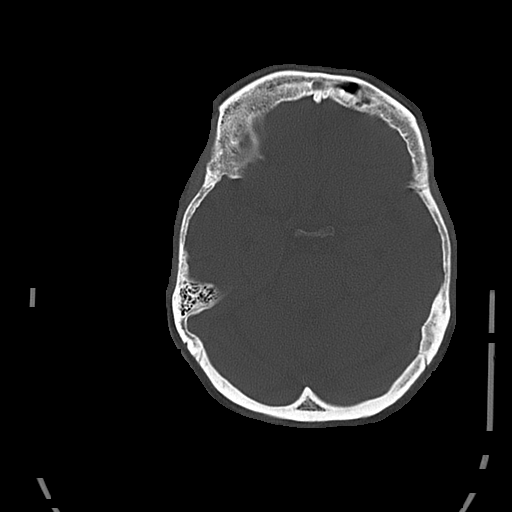
[im 27/72  brain]
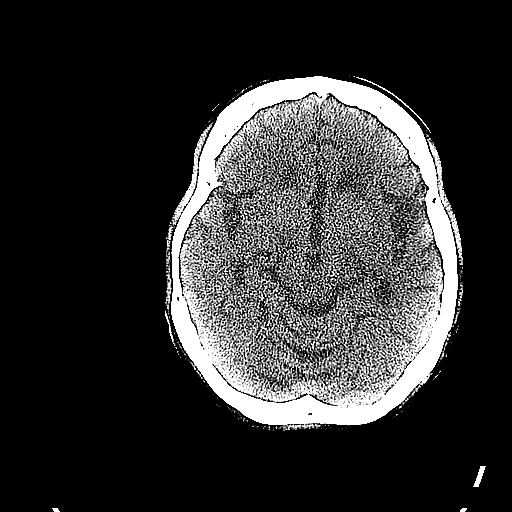
[im 30/72  brain]
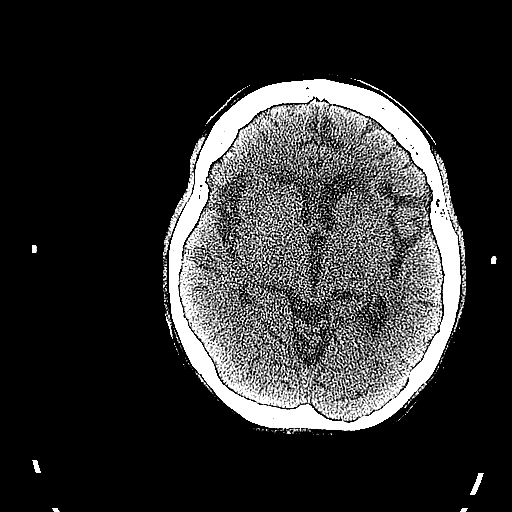
[im 34/72  brain]
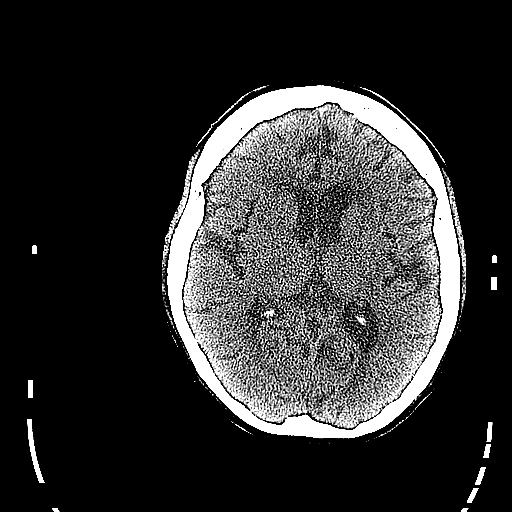
[im 38/72  brain]
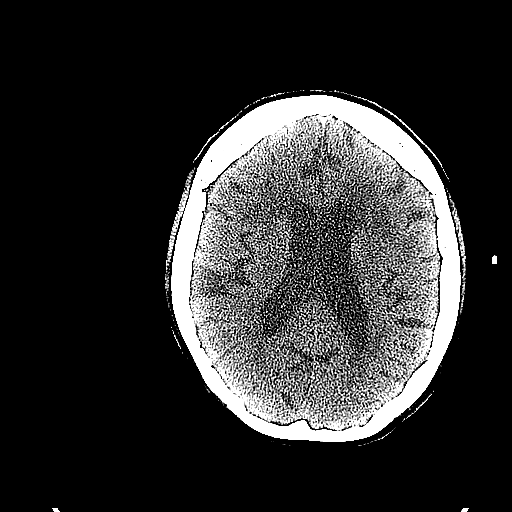
[im 38/72  bone]
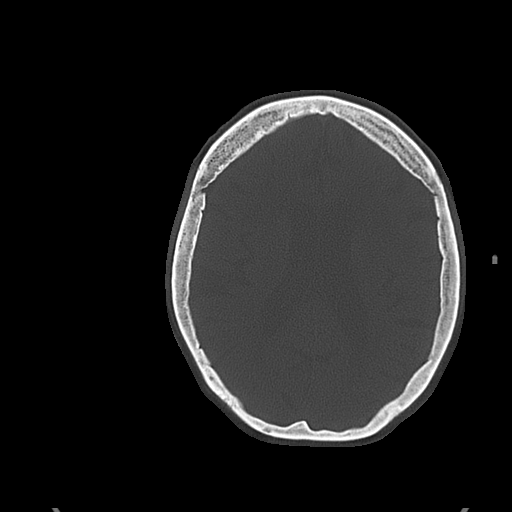
[im 42/72  brain]
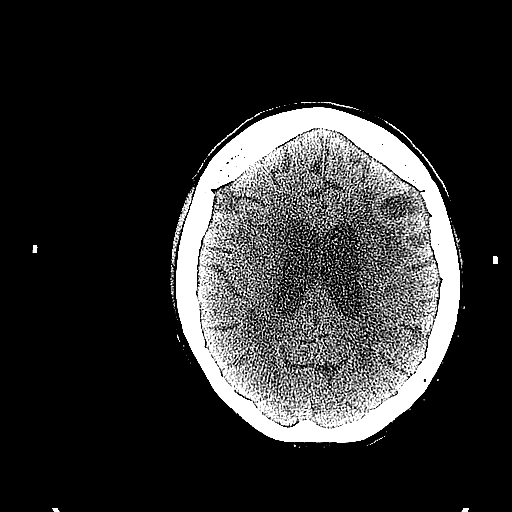
[im 45/72  brain]
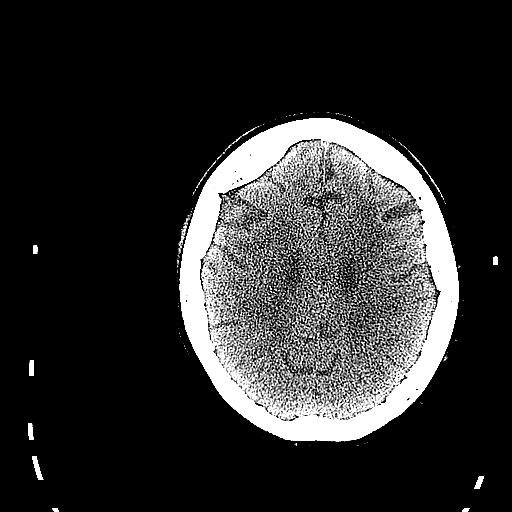
[im 49/72  brain]
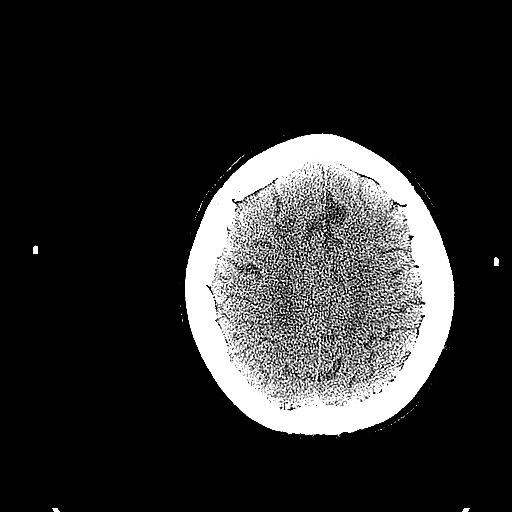
[im 57/72  brain]
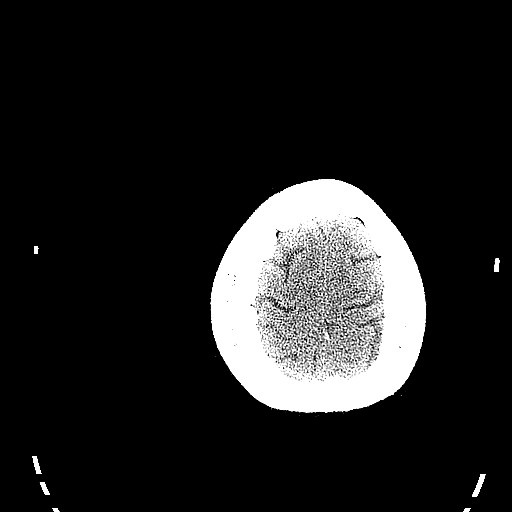
[im 57/72  bone]
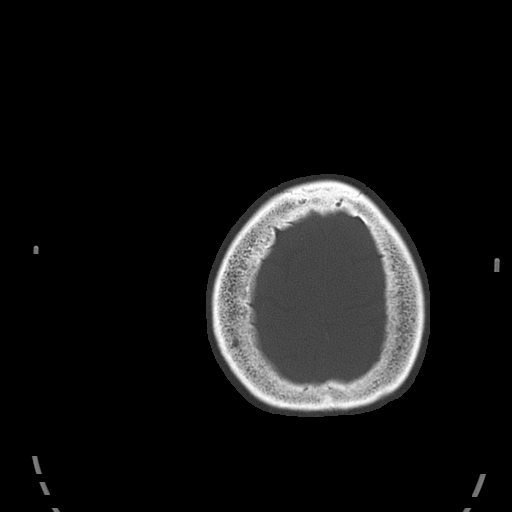
[im 60/72  brain]
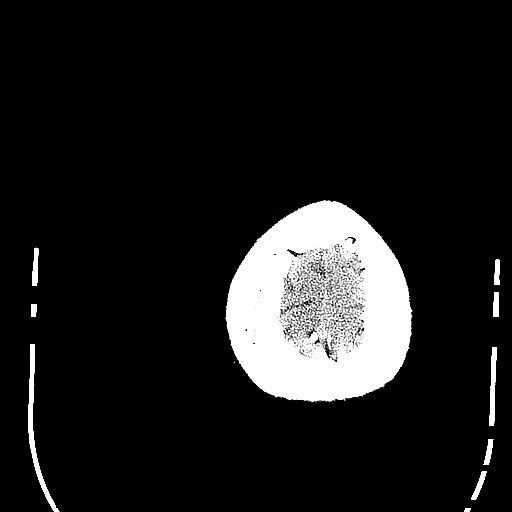
[im 64/72  brain]
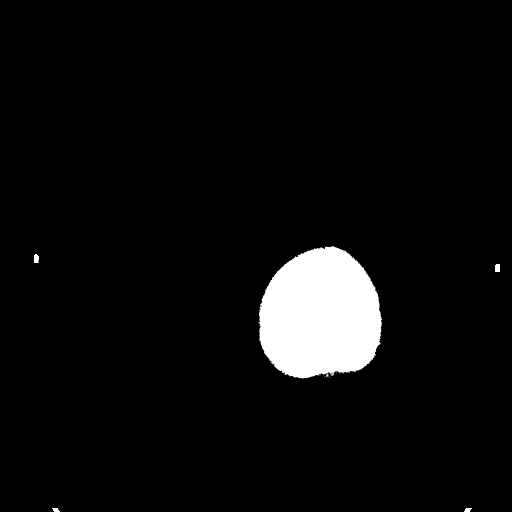
[im 68/72  brain]
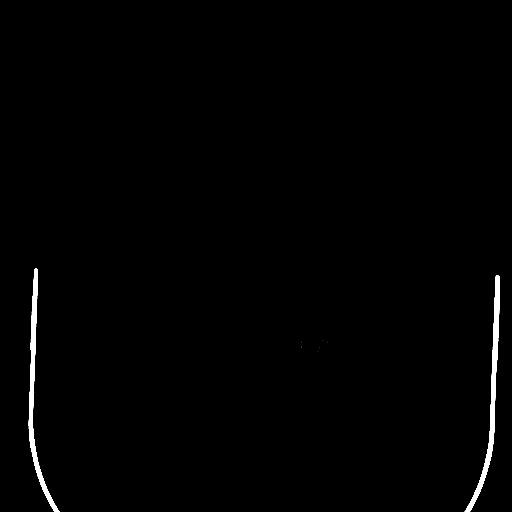

[16 of 30 positions shown; findings below may reference images not displayed]

FINDINGS: There is no evidence for acute infarction, intracranial
hemorrhage, mass lesion, hydrocephalus, or extra-axial fluid.
Moderate atrophy is present.  Advanced chronic microvascular
ischemic change is present in the periventricular and subcortical
white matter.

Calvarium intact.  Vascular calcification.  No acute sinus or
mastoid fluid.  Chronic left mastoiditis.  Similar appearance to
priors.
IMPRESSION: Atrophy and small vessel disease.  No acute intracranial findings.

## 2012-09-04 ENCOUNTER — Other Ambulatory Visit: Payer: Self-pay | Admitting: *Deleted

## 2012-09-04 DIAGNOSIS — C3490 Malignant neoplasm of unspecified part of unspecified bronchus or lung: Secondary | ICD-10-CM

## 2012-09-04 NOTE — Progress Notes (Signed)
[name] from [facility] called requesting that pt be schedule with Dr Donnald Garre. Pt last seen 08/2011 for lung cancer with Dr Donnald Garre. She had moved to [facility] and has now moved back. Per Dr Donnald Garre, pt needs records to be faxed so he can review them before scheduling a f/u visit. Kellie Au states that pt never saw anyone while in [place], she has only seen Dr Donnald Garre.    Per dr Donnald Garre, okay to schedule lab/ CT/ F/U with Dr Donnald Garre.  SLJ

## 2012-09-09 ENCOUNTER — Telehealth: Payer: Self-pay | Admitting: Internal Medicine

## 2012-09-09 NOTE — Telephone Encounter (Signed)
s/w heather at [facility] with the f/u appt with dr Donnald Garre      [place]

## 2012-09-10 ENCOUNTER — Other Ambulatory Visit (HOSPITAL_BASED_OUTPATIENT_CLINIC_OR_DEPARTMENT_OTHER): Payer: Medicare Other | Admitting: Lab

## 2012-09-10 ENCOUNTER — Ambulatory Visit (HOSPITAL_COMMUNITY)
Admission: RE | Admit: 2012-09-10 | Discharge: 2012-09-10 | Disposition: A | Discharge status: Home / Self Care | Payer: Medicare Other | Source: Ambulatory Visit | Attending: Internal Medicine | Admitting: Internal Medicine

## 2012-09-10 DIAGNOSIS — C3490 Malignant neoplasm of unspecified part of unspecified bronchus or lung: Secondary | ICD-10-CM | POA: Insufficient documentation

## 2012-09-10 DIAGNOSIS — C3410 Malignant neoplasm of upper lobe, unspecified bronchus or lung: Secondary | ICD-10-CM

## 2012-09-10 DIAGNOSIS — Z1289 Encounter for screening for malignant neoplasm of other sites: Secondary | ICD-10-CM | POA: Insufficient documentation

## 2012-09-10 DIAGNOSIS — Z9221 Personal history of antineoplastic chemotherapy: Secondary | ICD-10-CM | POA: Insufficient documentation

## 2012-09-10 LAB — CBC WITH DIFFERENTIAL/PLATELET
BASO%: 0.4 % (ref 0.0–2.0)
HCT: 41.4 % (ref 34.8–46.6)
LYMPH%: 24.7 % (ref 14.0–49.7)
MCHC: 33.5 g/dL (ref 31.5–36.0)
MCV: 93 fL (ref 79.5–101.0)
MONO#: 0.8 10*3/uL (ref 0.1–0.9)
MONO%: 16.6 % — ABNORMAL HIGH (ref 0.0–14.0)
NEUT%: 55.7 % (ref 38.4–76.8)
Platelets: 195 10*3/uL (ref 145–400)
WBC: 4.7 10*3/uL (ref 3.9–10.3)

## 2012-09-10 LAB — COMPREHENSIVE METABOLIC PANEL (CC13)
ALT: 21 U/L (ref 0–55)
AST: 19 U/L (ref 5–34)
Calcium: 9.5 mg/dL (ref 8.4–10.4)
Chloride: 99 mEq/L (ref 98–107)
Creatinine: 0.9 mg/dL (ref 0.6–1.1)
Sodium: 144 mEq/L (ref 136–145)
Total Bilirubin: 0.46 mg/dL (ref 0.20–1.20)
Total Protein: 6.5 g/dL (ref 6.4–8.3)

## 2012-09-10 MED ORDER — IOHEXOL 300 MG/ML  SOLN
100.0000 mL | Freq: Once | INTRAMUSCULAR | Status: AC | PRN
  Administered 2012-09-10: 100 mL via INTRAVENOUS

## 2012-09-18 ENCOUNTER — Ambulatory Visit (HOSPITAL_BASED_OUTPATIENT_CLINIC_OR_DEPARTMENT_OTHER): Payer: Medicare Other | Admitting: Internal Medicine

## 2012-09-18 ENCOUNTER — Telehealth: Payer: Self-pay | Admitting: Internal Medicine

## 2012-09-18 VITALS — BP 98/62 | HR 71 | Temp 98.4°F | Resp 18 | Ht 66.0 in | Wt 167.0 lb

## 2012-09-18 DIAGNOSIS — Z8542 Personal history of malignant neoplasm of other parts of uterus: Secondary | ICD-10-CM

## 2012-09-18 DIAGNOSIS — Z853 Personal history of malignant neoplasm of breast: Secondary | ICD-10-CM

## 2012-09-18 DIAGNOSIS — C3490 Malignant neoplasm of unspecified part of unspecified bronchus or lung: Secondary | ICD-10-CM

## 2012-09-18 DIAGNOSIS — C50519 Malignant neoplasm of lower-outer quadrant of unspecified female breast: Secondary | ICD-10-CM

## 2012-09-18 DIAGNOSIS — C3410 Malignant neoplasm of upper lobe, unspecified bronchus or lung: Secondary | ICD-10-CM

## 2012-09-18 DIAGNOSIS — C549 Malignant neoplasm of corpus uteri, unspecified: Secondary | ICD-10-CM

## 2012-09-18 NOTE — Progress Notes (Signed)
[facility] [phone]   [phone]  OFFICE PROGRESS NOTE  Warren, Kellie Curt, MD [address] [place] [address]  PRINCIPAL DIAGNOSES:   1. Stage IIIB (T2 N3 MX) non-small cell lung cancer, poorly differentiated carcinoma with focal squamous cell carcinoma diagnosed in August of 2008. 2. History of stage I (T1c NX MX) invasive lobular adenocarcinoma of the right breast diagnosed in August of 2010.  SECONDARY DIAGNOSES:   1. History of bilateral breast carcinoma diagnosed almost 14 years ago, status post bilateral lumpectomy followed by radiotherapy and tamoxifen for 5 years.  2. History of stage III endometrial carcinoma diagnosed in June of 2005, status post total abdominal hysterectomy with bilateral salpingo-oophorectomy. 3. Hypertension. 4. Obsessive-compulsive disorder. 5. Anxiety disorder. 6. Peripheral neuropathy.  PRIOR THERAPY:   1. Status post 6 cycles of systemic chemotherapy with carboplatin and paclitaxel.  Last dose was given 12/23/2007 for the non-small cell lung cancer and the patient had partial response to this treatment. 2. Status post breast-localized lumpectomy under the care of Dr. Jamey Ripa on 06/28/2009 with close resection margin.  CURRENT THERAPY:   1. Observation for the non-small cell lung cancer. 2. Arimidex 1 mg p.o. q. day for breast cancer started September of 2010.   INTERVAL HISTORY: [name] 78 y.o. female returns to the clinic today for followup visit. The patient was last seen on 03/13/2011. She lived for a year in [place]. She is now back to [facility] but expected to move to [place] to be close to her family soon. The patient is doing fine today with no specific complaints. She denied having any significant weight loss or night sweats. She denied having any chest pain but continues to have shortness breath with exertion, no cough or hemoptysis. She is currently a resident of  [facility] nursing facility. The patient had repeat CT scan of the chest, abdomen and pelvis performed recently and she is here for evaluation and discussion of her scan results.  MEDICAL HISTORY: Past Medical History  Diagnosis Date  . Cancer   . Hypertension   . Dementia   . COPD (chronic obstructive pulmonary disease)   . Peripheral neuropathy     ALLERGIES:   has no known allergies.  MEDICATIONS:  Current Outpatient Prescriptions  Medication Sig Dispense Refill  . aspirin 81 MG tablet Take 81 mg by mouth daily.        . calcium-vitamin D (OSCAL WITH D) 500-200 MG-UNIT per tablet Take 1 tablet by mouth daily.        Marland Kitchen diltiazem (CARDIZEM) 120 MG tablet Take 120 mg by mouth daily.        Marland Kitchen donepezil (ARICEPT) 10 MG tablet Take 10 mg by mouth at bedtime.        Marland Kitchen FLUoxetine (PROZAC) 40 MG capsule Take 40 mg by mouth daily.        . fluvoxaMINE (LUVOX) 100 MG tablet Take 100 mg by mouth daily.        . magnesium oxide (MAG-OX) 400 MG tablet Take 400 mg by mouth daily.        . metoprolol tartrate (LOPRESSOR) 25 MG tablet Take 25 mg by mouth daily.        . Multiple Vitamin (MULTIVITAMIN) tablet Take 1 tablet by mouth daily.        . naproxen sodium (ANAPROX) 220 MG tablet Take 220 mg by mouth 3 (three) times daily with meals.        Marland Kitchen  prochlorperazine (COMPAZINE) 10 MG tablet Take 10 mg by mouth every 6 (six) hours as needed.          SURGICAL HISTORY:  Past Surgical History  Procedure Date  . Breast lumpectomy   . Lung cancer surgery   . Uterine cancer     REVIEW OF SYSTEMS:  A comprehensive review of systems was negative except for: Respiratory: positive for dyspnea on exertion   PHYSICAL EXAMINATION: General appearance: alert, cooperative and no distress Head: Normocephalic, without obvious abnormality, atraumatic Neck: no adenopathy Resp: clear to auscultation bilaterally Cardio: regular rate and rhythm, S1, S2 normal, no murmur, click, rub or gallop GI: soft,  non-tender; bowel sounds normal; no masses,  no organomegaly Extremities: extremities normal, atraumatic, no cyanosis or edema  ECOG PERFORMANCE STATUS: 1 - Symptomatic but completely ambulatory  Blood pressure 98/62, pulse 71, temperature 98.4 F (36.9 C), temperature source Oral, resp. rate 18, height 5\' 6"  (1.676 m), weight 167 lb (75.751 kg).  LABORATORY DATA: Lab Results  Component Value Date   WBC 4.7 09/10/2012   HGB 13.9 09/10/2012   HCT 41.4 09/10/2012   MCV 93.0 09/10/2012   PLT 195 09/10/2012      Chemistry      Component Value Date/Time   NA 144 09/10/2012 0919   NA 140 06/29/2011 2356   NA 142 03/09/2011 1239   K 3.3* 09/10/2012 0919   K 3.9 06/29/2011 2356   K 3.7 03/09/2011 1239   CL 99 09/10/2012 0919   CL 107 06/29/2011 2356   CL 100 03/09/2011 1239   CO2 33* 09/10/2012 0919   CO2 24 06/21/2011 1621   CO2 27 03/09/2011 1239   BUN 22.0 09/10/2012 0919   BUN 22 06/29/2011 2356   BUN 11 03/09/2011 1239   CREATININE 0.9 09/10/2012 0919   CREATININE 0.90 06/29/2011 2356   CREATININE 0.8 03/09/2011 1239      Component Value Date/Time   CALCIUM 9.5 09/10/2012 0919   CALCIUM 8.6 06/21/2011 1621   CALCIUM 9.3 03/09/2011 1239   ALKPHOS 117 09/10/2012 0919   ALKPHOS 68 06/08/2011 0504   ALKPHOS 87* 03/09/2011 1239   AST 19 09/10/2012 0919   AST 17 06/08/2011 0504   AST 29 03/09/2011 1239   ALT 21 09/10/2012 0919   ALT 17 06/08/2011 0504   BILITOT 0.46 09/10/2012 0919   BILITOT 0.2* 06/08/2011 0504   BILITOT 0.60 03/09/2011 1239       RADIOGRAPHIC STUDIES: Ct Chest W Contrast  09/10/2012  *RADIOLOGY REPORT*  Clinical Data:  Rest of lung cancer.  Chemotherapy and radiation therapy complete.  CT CHEST, ABDOMEN AND PELVIS WITH CONTRAST  Technique:  Multidetector CT imaging of the chest, abdomen and pelvis was performed following the standard protocol during bolus administration of intravenous contrast.  Contrast: OMNIPAQUE IOHEXOL 300 MG/ML  SOLN  Comparison:   03/09/2011.   CT CHEST  Findings:  No pathologically enlarged mediastinal or axillary lymph nodes.  Bihilar lymphoid tissue.  Heart size normal.  No pericardial effusion.  Tiny hiatal hernia.  Emphysema.  Scattered pulmonary nodules measure up to 4 mm in the right upper lobe, stable from 03/09/2011.  Mild progressive volume loss in the lower lobes. 11 mm ground-glass nodule in the left lower lobe is unchanged from 03/09/2011.  Lingular scarring.  No pleural fluid.  Airway is unremarkable.  IMPRESSION:  1.  No evidence of recurrent or metastatic disease. 2.  Scattered tiny pulmonary nodules are unchanged from 03/09/2011. 3.  Left lower lobe ground-glass nodule is unchanged from 03/09/2011.  Continued attention on follow-up exams is warranted. 4.  Mild progressive volume loss in the lower lobes, right greater than left.   CT ABDOMEN AND PELVIS  Findings:  Focal low attenuation in the porta hepatis (image 61) is likely unchanged from 03/09/2011 may be artifactual or related to volume averaging with adjacent fat.  Liver, gallbladder, and adrenal glands are otherwise unremarkable.  Low attenuation lesions in the kidneys measure up to 5.6 cm on the right, as before. Bilateral renal cortical scarring. Low attenuation lesions in the spleen measure up to 10 mm, as before.  Pancreas, stomach and small bowel are unremarkable.  A fair amount of stool is seen in the colon.  Atherosclerotic calcification of the arterial vasculature without abdominal aortic aneurysm.  Scattered surgical clips in the retroperitoneum.  No pathologically enlarged lymph nodes.  No free fluid.  No worrisome lytic or sclerotic lesions.  Degenerative changes are seen in the spine.  IMPRESSION:  1.  No evidence of metastatic disease in the abdomen or pelvis. 2.  Probable constipation.   Original Report Authenticated By: Leanna Battles, M.D.    ASSESSMENT: This is a very pleasant 78 years old white female with history of stage IIIa non-small cell lung  cancer as well as a stage I invasive lobular adenocarcinoma of the right breast currently on treatment with Arimidex. The patient has no evidence for disease progression on the recent scan.  PLAN: I discussed the scan results with the patient today. I recommended for her to continue treatment with Arimidex 1 mg by mouth daily as well as observation for non-small cell lung cancer. I would see her back for followup visit in 6 months with repeat CT scan of the chest, abdomen and pelvis. If the patient decided to move to [place] she would need to establish care with an oncologist there to follow her up and order a skin at regular basis. I would be happy to help with this transition once she made her mind regarding the moving. She was advised to call me immediately if she has any concerning symptoms in the interval.  All questions were answered. The patient knows to call the clinic with any problems, questions or concerns. We can certainly see the patient much sooner if necessary.

## 2012-09-18 NOTE — Telephone Encounter (Signed)
gv pt appt schedule for June 2014..the patient aware that central scheduling will contact her d/t of ct..the patient did not want Barium and is aware that she needs to come in 2hrs early for water baised.

## 2012-09-18 NOTE — Patient Instructions (Signed)
No evidence for disease progression on his recent scan. Followup visit in 6 months with repeat CT scan.

## 2012-11-14 ENCOUNTER — Emergency Department (HOSPITAL_COMMUNITY)
Admission: EM | Admit: 2012-11-14 | Discharge: 2012-11-14 | Disposition: A | Discharge status: Home / Self Care | Payer: Medicare Other | Attending: Emergency Medicine | Admitting: Emergency Medicine

## 2012-11-14 DIAGNOSIS — J449 Chronic obstructive pulmonary disease, unspecified: Secondary | ICD-10-CM | POA: Insufficient documentation

## 2012-11-14 DIAGNOSIS — L989 Disorder of the skin and subcutaneous tissue, unspecified: Secondary | ICD-10-CM | POA: Insufficient documentation

## 2012-11-14 DIAGNOSIS — J4489 Other specified chronic obstructive pulmonary disease: Secondary | ICD-10-CM | POA: Insufficient documentation

## 2012-11-14 DIAGNOSIS — Z79899 Other long term (current) drug therapy: Secondary | ICD-10-CM | POA: Insufficient documentation

## 2012-11-14 DIAGNOSIS — I10 Essential (primary) hypertension: Secondary | ICD-10-CM | POA: Insufficient documentation

## 2012-11-14 DIAGNOSIS — Z8669 Personal history of other diseases of the nervous system and sense organs: Secondary | ICD-10-CM | POA: Insufficient documentation

## 2012-11-14 DIAGNOSIS — F0390 Unspecified dementia without behavioral disturbance: Secondary | ICD-10-CM | POA: Insufficient documentation

## 2012-11-14 DIAGNOSIS — Z859 Personal history of malignant neoplasm, unspecified: Secondary | ICD-10-CM | POA: Insufficient documentation

## 2012-11-14 MED ORDER — MUPIROCIN CALCIUM 2 % EX CREA
TOPICAL_CREAM | Freq: Once | CUTANEOUS | Status: AC
  Administered 2012-11-14: 1 via TOPICAL
  Filled 2012-11-14: qty 15

## 2012-11-14 MED ORDER — MUPIROCIN CALCIUM 2 % EX CREA: TOPICAL_CREAM | Freq: Two times a day (BID) | CUTANEOUS | Status: DC

## 2012-11-14 NOTE — ED Provider Notes (Signed)
Medical screening examination/treatment/procedure(s) were performed by non-physician practitioner and as supervising physician I was immediately available for consultation/collaboration.  Hermela Hardt R. Edlin Ford, MD 11/14/12 2344 

## 2012-11-14 NOTE — ED Notes (Signed)
Report called to [facility], [facility] called for transport.

## 2012-11-14 NOTE — ED Notes (Signed)
Caregiver verbalized understanding of d/c instructions

## 2012-11-14 NOTE — ED Provider Notes (Signed)
History     CSN: [id number]  Arrival date & time 11/14/12  1724   First MD Initiated Contact with Patient 11/14/12 2016      Chief Complaint  Patient presents with  . Otalgia    (Consider location/radiation/quality/duration/timing/severity/associated sxs/prior treatment) HPI Comments: For the past 1-2 weeks has noticed a patch of blood on the pillow case in the morning, on occasion she noticed a small amount of blood during the day.  Denies hearing loss, ear pain, trauma. Was seen at her facility and referred to ENT but does not have an appointment for several weeks and is concerned   Patient is a 79 y.o. female presenting with ear pain. The history is provided by the patient.  Otalgia This is a recurrent problem. The current episode started more than 1 week ago. There is pain in the left ear. The problem occurs daily. The problem has not changed since onset.There has been no fever. The pain is at a severity of 0/10. The patient is experiencing no pain. Pertinent negatives include no ear discharge, no headaches, no hearing loss, no rhinorrhea and no neck pain. Her past medical history does not include chronic ear infection or hearing loss.    Past Medical History  Diagnosis Date  . Cancer   . Hypertension   . Dementia   . COPD (chronic obstructive pulmonary disease)   . Peripheral neuropathy     Past Surgical History  Procedure Date  . Breast lumpectomy   . Lung cancer surgery   . Uterine cancer     No family history on file.  History  Substance Use Topics  . Smoking status: Unknown If Ever Smoked  . Smokeless tobacco: Not on file  . Alcohol Use: Yes     Comment: GLASS RED WINE    OB History    Grav Para Term Preterm Abortions TAB SAB Ect Mult Living                  Review of Systems  Constitutional: Negative for fever and chills.  HENT: Positive for ear pain. Negative for hearing loss, rhinorrhea, neck pain and ear discharge.   Eyes: Negative for visual  disturbance.  Respiratory: Negative.   Cardiovascular: Negative.   Gastrointestinal: Negative.   Genitourinary: Negative.   Musculoskeletal: Negative.   Skin: Negative.   Neurological: Negative for headaches.  Hematological: Negative.     Allergies  Review of patient's allergies indicates no known allergies.  Home Medications   Current Outpatient Rx  Name  Route  Sig  Dispense  Refill  . ANASTROZOLE 1 MG PO TABS   Oral   Take 1 mg by mouth daily.         . OCUVITE PO TABS   Oral   Take 1 tablet by mouth daily.         Marland Kitchen CARBAMIDE PEROXIDE 6.5 % OT SOLN      5 drops daily.         Marland Kitchen VITAMIN D 1000 UNITS PO TABS   Oral   Take 1,000 Units by mouth daily.         Marland Kitchen DIVALPROEX SODIUM 125 MG PO CPSP   Oral   Take 250-375 mg by mouth 2 (two) times daily. Take 3 capsules every morning and 2 capsules every evening.         . DONEPEZIL HCL 10 MG PO TABS   Oral   Take 10 mg by mouth at bedtime.           Marland Kitchen  GABAPENTIN 600 MG PO TABS   Oral   Take 600 mg by mouth daily.         Marland Kitchen METOPROLOL TARTRATE 25 MG PO TABS   Oral   Take 25 mg by mouth daily.           Marland Kitchen OMEPRAZOLE 20 MG PO CPDR   Oral   Take 20 mg by mouth daily.         . OXYCODONE-ACETAMINOPHEN 5-325 MG PO TABS   Oral   Take 1-2 tablets by mouth 2 (two) times daily. Take 1 tablet every morning and 2 tablets every evening for pain.         . ACETAMINOPHEN 325 MG PO TABS   Oral   Take 650 mg by mouth every 4 (four) hours as needed.         . BUSPIRONE HCL 10 MG PO TABS   Oral   Take 10 mg by mouth 2 (two) times daily.         Marland Kitchen CALCIUM CARBONATE-VITAMIN D 500-200 MG-UNIT PO TABS   Oral   Take 1 tablet by mouth daily.           Marland Kitchen DEXTROMETHORPHAN-GUAIFENESIN 10-100 MG/5ML PO LIQD   Oral   Take 5 mLs by mouth every 4 (four) hours as needed.         Marland Kitchen FERROUSUL PO   Oral   Take by mouth 2 (two) times daily.         . FUROSEMIDE 40 MG PO TABS   Oral   Take 40 mg by  mouth daily.         Marland Kitchen HYDROXYZINE HCL 25 MG PO TABS   Oral   Take 25 mg by mouth 3 (three) times daily as needed.         . MENTHOL (TOPICAL ANALGESIC) 4 % EX GEL   Apply externally   Apply topically. Ever 6 hrs         . QUETIAPINE FUMARATE 50 MG PO TABS   Oral   Take 50 mg by mouth at bedtime.         Marland Kitchen SACCHAROMYCES BOULARDII 250 MG PO CAPS   Oral   Take 250 mg by mouth 2 (two) times daily.         . TRAMADOL HCL 50 MG PO TABS   Oral   Take 50 mg by mouth every 4 (four) hours as needed.           BP 137/89  Pulse 74  Temp 97.9 F (36.6 C) (Oral)  Resp 16  SpO2 96%  Physical Exam  Constitutional: She is oriented to person, place, and time. She appears well-developed and well-nourished. No distress.  HENT:  Head: Normocephalic and atraumatic.  Right Ear: Hearing, tympanic membrane and external ear normal.  Left Ear: Hearing and tympanic membrane normal. No lacerations. No drainage or swelling.  No middle ear effusion. No decreased hearing is noted.  Ears:  Mouth/Throat: Oropharynx is clear and moist.       Small scabbed over area at 7 o'clock location without surrounding erythema   Neck: Normal range of motion.  Cardiovascular: Normal rate and regular rhythm.   Pulmonary/Chest: Effort normal.  Lymphadenopathy:    She has no cervical adenopathy.  Neurological: She is alert and oriented to person, place, and time.  Skin: Skin is warm.    ED Course  Procedures (including critical care time)  Labs Reviewed - No data to  display No results found.   No diagnosis found.    MDM   Small sore in ear canal will treat with topical Bactroban         Arman Filter, NP 11/14/12 2040  Arman Filter, NP 11/14/12 2048

## 2012-12-30 ENCOUNTER — Non-Acute Institutional Stay (SKILLED_NURSING_FACILITY): Payer: Medicare Other | Admitting: Adult Health

## 2012-12-30 DIAGNOSIS — R609 Edema, unspecified: Secondary | ICD-10-CM

## 2012-12-30 DIAGNOSIS — I10 Essential (primary) hypertension: Secondary | ICD-10-CM

## 2012-12-30 DIAGNOSIS — F29 Unspecified psychosis not due to a substance or known physiological condition: Secondary | ICD-10-CM

## 2012-12-30 DIAGNOSIS — K219 Gastro-esophageal reflux disease without esophagitis: Secondary | ICD-10-CM

## 2012-12-30 DIAGNOSIS — F411 Generalized anxiety disorder: Secondary | ICD-10-CM

## 2012-12-30 DIAGNOSIS — Z853 Personal history of malignant neoplasm of breast: Secondary | ICD-10-CM

## 2012-12-30 DIAGNOSIS — E559 Vitamin D deficiency, unspecified: Secondary | ICD-10-CM

## 2012-12-30 DIAGNOSIS — C549 Malignant neoplasm of corpus uteri, unspecified: Secondary | ICD-10-CM

## 2012-12-30 DIAGNOSIS — G8929 Other chronic pain: Secondary | ICD-10-CM

## 2012-12-30 DIAGNOSIS — D649 Anemia, unspecified: Secondary | ICD-10-CM

## 2012-12-30 NOTE — Progress Notes (Signed)
Subjective:     Patient ID: [name], female   DOB: [birth year], 79 y.o.   MRN: [id number] Chief Complaint  Patient presents with  . Medical Managment of Chronic Issues   HPI  HYPERTENSION She is presently stable is taking lopressor 25 mg daily   ADENOCARCINOMA, ENDOMETRIUM    ADENOCARCINOMA, BREAST, BILATERAL, HX OF No change in status is taking anastrozole 1 mg daily long term therapy   ANXIETY Is stable is taking buspar 10 mg twice daily does take depakote 250 mg in the am and 375 mg in the pm to help stabilize mood   ANEMIA She is stable is taking iron twice daily  Psychosis She is stable overall she is followed by facility psych services is taking seroquel 50 mg nightly does take depakote 250 mg in the am and 375 mg in the pm to help stabilize her mood.   GERD (gastroesophageal reflux disease) She is stable is taking prilosec 20 mg daily   Edema She is stable is taking lasix 40 mg daily  Chronic pain Pain is being managed is taking neurontin 600 mg daily takes percocet 5/325 mg 2 tabs twice daily and has ultram 50 mg every 4 hours as needed for pain  Unspecified vitamin D deficiency Takes vitamin d 1000 units daily    Past Medical History  Diagnosis Date  . Cancer   . Hypertension   . Dementia   . COPD (chronic obstructive pulmonary disease)   . Peripheral neuropathy    Past Surgical History  Procedure Laterality Date  . Breast lumpectomy    . Lung cancer surgery    . Uterine cancer     Filed Vitals:   01/07/13 1043  BP: 103/72  Pulse: 81  Height: 5\' 7"  (1.702 m)  Weight: 161 lb (73.029 kg)   Review of Systems  Constitutional: Negative for appetite change and fatigue.  Respiratory: Negative for cough, shortness of breath and wheezing.   Cardiovascular: Negative for chest pain, palpitations and leg swelling.  Gastrointestinal: Negative for abdominal pain and constipation.  Musculoskeletal: Negative for myalgias, back pain and arthralgias.   Skin: Negative.   Neurological: Negative for headaches.  Psychiatric/Behavioral: Negative.        Objective:   Physical Exam  Constitutional: She is oriented to person, place, and time. She appears well-developed and well-nourished.  HENT:  Head: Normocephalic.  Neck: Neck supple.  Cardiovascular: Normal rate, regular rhythm and intact distal pulses.   Pulmonary/Chest: Effort normal and breath sounds normal.  Abdominal: Soft. Bowel sounds are normal.  Musculoskeletal: Normal range of motion.  Uses wheelcchair  Neurological: She is alert and oriented to person, place, and time.  Skin: Skin is warm and dry.  Psychiatric: She has a normal mood and affect.  lab reviewed:  12-10-12: hgb a1c 5.6      Assessment:    edema; chronic pain vit d breast cancer; anxiety; psychosis; hypertension; anemia   Plan:    Patient's condition is stable; no changes in medications are recommended. We will continue to monitor and make changes as necessary.

## 2013-01-07 ENCOUNTER — Encounter: Payer: Self-pay | Admitting: Adult Health

## 2013-01-07 DIAGNOSIS — K219 Gastro-esophageal reflux disease without esophagitis: Secondary | ICD-10-CM | POA: Insufficient documentation

## 2013-01-07 DIAGNOSIS — F29 Unspecified psychosis not due to a substance or known physiological condition: Secondary | ICD-10-CM | POA: Insufficient documentation

## 2013-01-07 DIAGNOSIS — R609 Edema, unspecified: Secondary | ICD-10-CM | POA: Insufficient documentation

## 2013-01-07 DIAGNOSIS — G8929 Other chronic pain: Secondary | ICD-10-CM | POA: Insufficient documentation

## 2013-01-07 DIAGNOSIS — E559 Vitamin D deficiency, unspecified: Secondary | ICD-10-CM | POA: Insufficient documentation

## 2013-01-07 HISTORY — DX: Gastro-esophageal reflux disease without esophagitis: K21.9

## 2013-01-07 NOTE — Assessment & Plan Note (Signed)
She is stable is taking iron twice daily

## 2013-01-07 NOTE — Assessment & Plan Note (Signed)
She is presently stable is taking lopressor 25 mg daily

## 2013-01-07 NOTE — Assessment & Plan Note (Signed)
Is stable is taking buspar 10 mg twice daily does take depakote 250 mg in the am and 375 mg in the pm to help stabilize mood

## 2013-01-07 NOTE — Assessment & Plan Note (Signed)
She is stable is taking lasix 40 mg daily

## 2013-01-07 NOTE — Assessment & Plan Note (Signed)
Pain is being managed is taking neurontin 600 mg daily takes percocet 5/325 mg 2 tabs twice daily and has ultram 50 mg every 4 hours as needed for pain

## 2013-01-07 NOTE — Assessment & Plan Note (Signed)
She is stable is taking prilosec 20 mg daily

## 2013-01-07 NOTE — Assessment & Plan Note (Signed)
She is stable overall she is followed by facility psych services is taking seroquel 50 mg nightly does take depakote 250 mg in the am and 375 mg in the pm to help stabilize her mood.

## 2013-01-07 NOTE — Assessment & Plan Note (Signed)
Takes vitamin d 1000 units daily

## 2013-01-07 NOTE — Assessment & Plan Note (Signed)
No change in status is taking anastrozole 1 mg daily long term therapy

## 2013-01-30 ENCOUNTER — Telehealth: Payer: Self-pay | Admitting: Internal Medicine

## 2013-02-24 ENCOUNTER — Non-Acute Institutional Stay (SKILLED_NURSING_FACILITY): Payer: Medicare Other | Admitting: Adult Health

## 2013-02-24 ENCOUNTER — Encounter: Payer: Self-pay | Admitting: Adult Health

## 2013-02-24 DIAGNOSIS — D649 Anemia, unspecified: Secondary | ICD-10-CM

## 2013-02-24 DIAGNOSIS — K219 Gastro-esophageal reflux disease without esophagitis: Secondary | ICD-10-CM

## 2013-02-24 DIAGNOSIS — R609 Edema, unspecified: Secondary | ICD-10-CM

## 2013-02-24 DIAGNOSIS — G629 Polyneuropathy, unspecified: Secondary | ICD-10-CM

## 2013-02-24 DIAGNOSIS — Z853 Personal history of malignant neoplasm of breast: Secondary | ICD-10-CM

## 2013-02-24 DIAGNOSIS — F411 Generalized anxiety disorder: Secondary | ICD-10-CM

## 2013-02-24 DIAGNOSIS — G609 Hereditary and idiopathic neuropathy, unspecified: Secondary | ICD-10-CM

## 2013-02-24 DIAGNOSIS — I10 Essential (primary) hypertension: Secondary | ICD-10-CM

## 2013-03-04 ENCOUNTER — Other Ambulatory Visit: Payer: Self-pay | Admitting: *Deleted

## 2013-03-04 MED ORDER — OXYCODONE-ACETAMINOPHEN 5-325 MG PO TABS: ORAL_TABLET | ORAL | Status: DC

## 2013-03-18 ENCOUNTER — Encounter (HOSPITAL_COMMUNITY): Payer: Self-pay

## 2013-03-18 ENCOUNTER — Other Ambulatory Visit (HOSPITAL_BASED_OUTPATIENT_CLINIC_OR_DEPARTMENT_OTHER): Payer: Medicare Other | Admitting: Lab

## 2013-03-18 ENCOUNTER — Ambulatory Visit (HOSPITAL_COMMUNITY)
Admission: RE | Admit: 2013-03-18 | Discharge: 2013-03-18 | Disposition: A | Discharge status: Home / Self Care | Payer: Medicare Other | Source: Ambulatory Visit | Attending: Internal Medicine | Admitting: Internal Medicine

## 2013-03-18 ENCOUNTER — Ambulatory Visit (HOSPITAL_COMMUNITY): Admission: RE | Admit: 2013-03-18 | Payer: Medicare Other | Source: Ambulatory Visit

## 2013-03-18 DIAGNOSIS — C3410 Malignant neoplasm of upper lobe, unspecified bronchus or lung: Secondary | ICD-10-CM

## 2013-03-18 DIAGNOSIS — R911 Solitary pulmonary nodule: Secondary | ICD-10-CM | POA: Insufficient documentation

## 2013-03-18 DIAGNOSIS — Z9221 Personal history of antineoplastic chemotherapy: Secondary | ICD-10-CM | POA: Insufficient documentation

## 2013-03-18 DIAGNOSIS — C3490 Malignant neoplasm of unspecified part of unspecified bronchus or lung: Secondary | ICD-10-CM

## 2013-03-18 DIAGNOSIS — Z853 Personal history of malignant neoplasm of breast: Secondary | ICD-10-CM

## 2013-03-18 DIAGNOSIS — C50919 Malignant neoplasm of unspecified site of unspecified female breast: Secondary | ICD-10-CM | POA: Insufficient documentation

## 2013-03-18 DIAGNOSIS — Z923 Personal history of irradiation: Secondary | ICD-10-CM | POA: Insufficient documentation

## 2013-03-18 DIAGNOSIS — C50519 Malignant neoplasm of lower-outer quadrant of unspecified female breast: Secondary | ICD-10-CM

## 2013-03-18 DIAGNOSIS — C549 Malignant neoplasm of corpus uteri, unspecified: Secondary | ICD-10-CM

## 2013-03-18 LAB — CBC WITH DIFFERENTIAL/PLATELET
Basophils Absolute: 0 10*3/uL (ref 0.0–0.1)
Eosinophils Absolute: 0.1 10*3/uL (ref 0.0–0.5)
HCT: 40.6 % (ref 34.8–46.6)
HGB: 13.8 g/dL (ref 11.6–15.9)
LYMPH%: 31 % (ref 14.0–49.7)
MCV: 94.5 fL (ref 79.5–101.0)
MONO%: 11.4 % (ref 0.0–14.0)
NEUT#: 3 10*3/uL (ref 1.5–6.5)
NEUT%: 54.8 % (ref 38.4–76.8)
Platelets: 164 10*3/uL (ref 145–400)

## 2013-03-18 LAB — COMPREHENSIVE METABOLIC PANEL (CC13)
AST: 15 U/L (ref 5–34)
Albumin: 3.3 g/dL — ABNORMAL LOW (ref 3.5–5.0)
Alkaline Phosphatase: 88 U/L (ref 40–150)
BUN: 16.9 mg/dL (ref 7.0–26.0)
Potassium: 3.5 mEq/L (ref 3.5–5.1)

## 2013-03-18 MED ORDER — IOHEXOL 300 MG/ML  SOLN
50.0000 mL | Freq: Once | INTRAMUSCULAR | Status: AC | PRN
  Administered 2013-03-18: 50 mL via ORAL

## 2013-03-18 MED ORDER — IOHEXOL 300 MG/ML  SOLN
100.0000 mL | Freq: Once | INTRAMUSCULAR | Status: AC | PRN
  Administered 2013-03-18: 100 mL via INTRAVENOUS

## 2013-03-19 ENCOUNTER — Ambulatory Visit: Payer: Medicare Other | Admitting: Internal Medicine

## 2013-03-26 ENCOUNTER — Ambulatory Visit (HOSPITAL_BASED_OUTPATIENT_CLINIC_OR_DEPARTMENT_OTHER): Payer: Medicare Other | Admitting: Internal Medicine

## 2013-03-26 ENCOUNTER — Telehealth: Payer: Self-pay | Admitting: Internal Medicine

## 2013-03-26 ENCOUNTER — Encounter: Payer: Self-pay | Admitting: Internal Medicine

## 2013-03-26 VITALS — BP 105/56 | HR 67 | Resp 18 | Ht 67.0 in | Wt 159.8 lb

## 2013-03-26 DIAGNOSIS — Z8542 Personal history of malignant neoplasm of other parts of uterus: Secondary | ICD-10-CM

## 2013-03-26 DIAGNOSIS — Z853 Personal history of malignant neoplasm of breast: Secondary | ICD-10-CM

## 2013-03-26 DIAGNOSIS — C549 Malignant neoplasm of corpus uteri, unspecified: Secondary | ICD-10-CM

## 2013-03-26 DIAGNOSIS — C50519 Malignant neoplasm of lower-outer quadrant of unspecified female breast: Secondary | ICD-10-CM

## 2013-03-26 DIAGNOSIS — C3410 Malignant neoplasm of upper lobe, unspecified bronchus or lung: Secondary | ICD-10-CM

## 2013-03-26 NOTE — Progress Notes (Signed)
[facility] [phone]   [phone]  OFFICE PROGRESS NOTE  REED, TIFFANY, DO [address] [place] [address]  PRINCIPAL DIAGNOSES:  1. Stage IIIB (T2 N3 MX) non-small cell lung cancer, poorly differentiated carcinoma with focal squamous cell carcinoma diagnosed in August of 2008. 2. History of stage I (T1c NX MX) invasive lobular adenocarcinoma of the right breast diagnosed in August of 2010. SECONDARY DIAGNOSES:  1. History of bilateral breast carcinoma diagnosed almost 14 years ago, status post bilateral lumpectomy followed by radiotherapy and tamoxifen for 5 years.  2. History of stage III endometrial carcinoma diagnosed in June of 2005, status post total abdominal hysterectomy with bilateral salpingo-oophorectomy. 3. Hypertension. 4. Obsessive-compulsive disorder. 5. Anxiety disorder. 6. Peripheral neuropathy. PRIOR THERAPY:  1. Status post 6 cycles of systemic chemotherapy with carboplatin and paclitaxel. Last dose was given 12/23/2007 for the non-small cell lung cancer and the patient had partial response to this treatment. 2. Status post breast-localized lumpectomy under the care of Dr. Jamey Ripa on 06/28/2009 with close resection margin. CURRENT THERAPY:  1. Observation for the non-small cell lung cancer. 2. Arimidex 1 mg p.o. q. day for breast cancer started September of 2010.  INTERVAL HISTORY: [name] 79 y.o. female returns to the clinic today for routine six-month follow up visit. The patient is feeling fine today with no specific complaints. She denied having any significant fatigue or weakness. She has no chest pain, shortness of breath, cough or hemoptysis. The patient has no nausea or vomiting. She is tolerating her treatment with Arimidex fairly well with no significant adverse effects. She had repeat CT scan of the chest, abdomen and pelvis performed recently and she is here for evaluation and discussion of her scan  results. The patient mentions that she is in the process of moving to [place] to be close to her brothers.  MEDICAL HISTORY: Past Medical History  Diagnosis Date  . Cancer   . Hypertension   . Dementia   . COPD (chronic obstructive pulmonary disease)   . Peripheral neuropathy     ALLERGIES:  is allergic to abilify.  MEDICATIONS:  Current Outpatient Prescriptions  Medication Sig Dispense Refill  . anastrozole (ARIMIDEX) 1 MG tablet Take 1 mg by mouth daily.      . beta carotene w/minerals (OCUVITE) tablet Take 1 tablet by mouth daily.      . busPIRone (BUSPAR) 10 MG tablet Take 10 mg by mouth 2 (two) times daily.      . cholecalciferol (VITAMIN D) 1000 UNITS tablet Take 1,000 Units by mouth daily.      Marland Kitchen dextromethorphan-guaiFENesin (ROBITUSSIN-DM) 10-100 MG/5ML liquid Take 5 mLs by mouth every 4 (four) hours as needed for cough.      . divalproex (DEPAKOTE SPRINKLE) 125 MG capsule Take 250-375 mg by mouth 2 (two) times daily. Take 3 capsules every morning and 2 capsules every evening.      . donepezil (ARICEPT) 10 MG tablet Take 10 mg by mouth at bedtime.        . Ferrous Sulfate (FERROUSUL PO) Take by mouth 2 (two) times daily.      . furosemide (LASIX) 40 MG tablet Take 40 mg by mouth daily.       Marland Kitchen gabapentin (NEURONTIN) 600 MG tablet Take 600 mg by mouth daily.      . hydrOXYzine (ATARAX/VISTARIL) 25 MG tablet Take 25 mg by mouth 3 (three) times daily as needed. Itching.      Marland Kitchen  Menthol, Topical Analgesic, (BIOFREEZE ROLL-ON) 4 % GEL Apply topically. Ever 6 hrs      . metoprolol tartrate (LOPRESSOR) 25 MG tablet Take 25 mg by mouth daily.        Marland Kitchen omeprazole (PRILOSEC) 20 MG capsule Take 20 mg by mouth daily.      Marland Kitchen oxyCODONE-acetaminophen (PERCOCET/ROXICET) 5-325 MG per tablet Take 1 tablet once a day. Take 2 tablets at bedtime. Take 1 tablet every 12 hours as needed for pain.  150 tablet  0  . QUEtiapine (SEROQUEL) 50 MG tablet Take 50 mg by mouth at bedtime.      . sodium  chloride (OCEAN) 0.65 % nasal spray Place 1 spray into the nose at bedtime.      . traMADol (ULTRAM) 50 MG tablet Take 50 mg by mouth every 6 (six) hours as needed for pain.      Marland Kitchen acetaminophen (TYLENOL) 325 MG tablet Take 650 mg by mouth every 4 (four) hours as needed. Pain.      . carbamide peroxide (DEBROX) 6.5 % otic solution 5 drops daily.      . mupirocin cream (BACTROBAN) 2 % Apply topically 2 (two) times daily. Apply to left ear canal 2 times a day for the next 7 days  15 g  0  . oseltamivir (TAMIFLU) 75 MG capsule Take 75 mg by mouth daily.       No current facility-administered medications for this visit.    SURGICAL HISTORY:  Past Surgical History  Procedure Laterality Date  . Breast lumpectomy    . Lung cancer surgery    . Uterine cancer      REVIEW OF SYSTEMS:  A comprehensive review of systems was negative.   PHYSICAL EXAMINATION: General appearance: alert, cooperative and no distress Head: Normocephalic, without obvious abnormality, atraumatic Neck: no adenopathy Lymph nodes: Cervical, supraclavicular, and axillary nodes normal. Resp: clear to auscultation bilaterally Cardio: regular rate and rhythm, S1, S2 normal, no murmur, click, rub or gallop GI: soft, non-tender; bowel sounds normal; no masses,  no organomegaly Extremities: extremities normal, atraumatic, no cyanosis or edema  ECOG PERFORMANCE STATUS: 2 - Symptomatic, <50% confined to bed  Blood pressure 105/56, pulse 67, resp. rate 18, height 5\' 7"  (1.702 m), weight 159 lb 12.8 oz (72.485 kg).  LABORATORY DATA: Lab Results  Component Value Date   WBC 5.4 03/18/2013   HGB 13.8 03/18/2013   HCT 40.6 03/18/2013   MCV 94.5 03/18/2013   PLT 164 03/18/2013      Chemistry      Component Value Date/Time   NA 145 03/18/2013 1134   NA 140 06/29/2011 2356   NA 142 03/09/2011 1239   K 3.5 03/18/2013 1134   K 3.9 06/29/2011 2356   K 3.7 03/09/2011 1239   CL 104 03/18/2013 1134   CL 107 06/29/2011 2356   CL 100 03/09/2011 1239    CO2 31* 03/18/2013 1134   CO2 24 06/21/2011 1621   CO2 27 03/09/2011 1239   BUN 16.9 03/18/2013 1134   BUN 22 06/29/2011 2356   BUN 11 03/09/2011 1239   CREATININE 0.8 03/18/2013 1134   CREATININE 0.90 06/29/2011 2356   CREATININE 0.8 03/09/2011 1239      Component Value Date/Time   CALCIUM 8.8 03/18/2013 1134   CALCIUM 8.6 06/21/2011 1621   CALCIUM 9.3 03/09/2011 1239   ALKPHOS 88 03/18/2013 1134   ALKPHOS 68 06/08/2011 0504   ALKPHOS 87* 03/09/2011 1239   AST 15 03/18/2013 1134  AST 17 06/08/2011 0504   AST 29 03/09/2011 1239   ALT 16 03/18/2013 1134   ALT 17 06/08/2011 0504   BILITOT 0.54 03/18/2013 1134   BILITOT 0.2* 06/08/2011 0504   BILITOT 0.60 03/09/2011 1239       RADIOGRAPHIC STUDIES: Ct Chest W Contrast  03/18/2013   *RADIOLOGY REPORT*  Clinical Data:  Lung and breast cancer diagnosed 1009. Chemotherapy radiation therapy complete.  CT CHEST, ABDOMEN AND PELVIS WITH CONTRAST  Technique:  Multidetector CT imaging of the chest, abdomen and pelvis was performed following the standard protocol during bolus administration of intravenous contrast.  Contrast: 50mL OMNIPAQUE IOHEXOL 300 MG/ML  SOLN, OMNIPAQUE IOHEXOL 300 MG/ML  SOLN  Comparison:  CT 09/10/2012   CT CHEST  Findings:  No axillary or supraclavicular lymphadenopathy.  No mediastinal or hilar lymphadenopathy.  No pericardial fluid.  There is oral contrast within the esophagus.  No central pulmonary embolism.  4 mm right upper lobe pulmonary nodule (image 30)  is not changed from prior.  Several other smaller pulmonary nodules are unchanged in size.  Ill-defined ground-glass nodule in the medial aspect left lower lobe measuring 11 mm  and is not changed from prior.  IMPRESSION:  1.  No evidence of lung cancer recurrence in the thorax. 2.  Contrast in the esophagus could represent gastroesophageal reflux.   CT ABDOMEN AND PELVIS  Findings:  No focal hepatic lesion.  Gallbladder and pancreas are normal.  There is a low density lesion in the  spleen measuring 10 mm which is not changed from prior.  Adrenal gland is mildly thickened on the left not changed from prior.  There is a renal cortical thinning on the left.  There is a nonenhancing cyst within the right kidney.  The stomach, small bowel, and colon are normal.  Abdominal aorta is normal caliber.  No retroperitoneal periportal lymphadenopathy.  There is no free fluid the pelvis.  Post hysterectomy.  Bladder is normal.  No pelvic lymphadenopathy. Review of  bone windows demonstrates no aggressive osseous lesions.  IMPRESSION:  1.  No evidence metastasis in the abdomen or pelvis.  2. Stable renal cortical thinning, adrenal thickening, and small hypodense lesions within the spleen.   Original Report Authenticated By: Genevive Bi, M.D.   ASSESSMENT AND PLAN: this is a very pleasant 79 years old white female with history of stage IIIB non-small cell lung cancer as well as history of a stage I breast carcinoma currently on treatment with Arimidex and tolerating it fairly well. The patient has no evidence for disease progression on his recent scan. I discussed the scan results with the patient and recommended for her to continue on observation. I would see her back for follow up visit in 6 months with repeat CT scan of the chest, abdomen and pelvis without contrast. If the patient decided to move to [place] she would need follow up and evaluation by a medical oncologist in that area and I would be happy to arrange this if needed. She will let us know of her final plan. She would continue treatment with Arimidex as ordered 1 mg by mouth daily. All questions were answered. The patient knows to call the clinic with any problems, questions or concerns. We can certainly see the patient much sooner if necessary.

## 2013-03-26 NOTE — Telephone Encounter (Signed)
gv and printed appt sched and avs for pt....pt wants water based

## 2013-03-27 NOTE — Patient Instructions (Signed)
Continue Arimidex. Follow up visit in 6 months with repeat scan

## 2013-04-28 ENCOUNTER — Non-Acute Institutional Stay (SKILLED_NURSING_FACILITY): Payer: Medicare Other | Admitting: Adult Health

## 2013-04-28 DIAGNOSIS — I10 Essential (primary) hypertension: Secondary | ICD-10-CM

## 2013-04-28 DIAGNOSIS — G609 Hereditary and idiopathic neuropathy, unspecified: Secondary | ICD-10-CM

## 2013-04-28 DIAGNOSIS — G629 Polyneuropathy, unspecified: Secondary | ICD-10-CM

## 2013-04-28 DIAGNOSIS — F0390 Unspecified dementia without behavioral disturbance: Secondary | ICD-10-CM

## 2013-04-28 DIAGNOSIS — D649 Anemia, unspecified: Secondary | ICD-10-CM

## 2013-04-28 DIAGNOSIS — R609 Edema, unspecified: Secondary | ICD-10-CM | POA: Insufficient documentation

## 2013-04-28 DIAGNOSIS — K219 Gastro-esophageal reflux disease without esophagitis: Secondary | ICD-10-CM

## 2013-04-28 DIAGNOSIS — F411 Generalized anxiety disorder: Secondary | ICD-10-CM

## 2013-04-28 DIAGNOSIS — Z853 Personal history of malignant neoplasm of breast: Secondary | ICD-10-CM

## 2013-04-29 ENCOUNTER — Non-Acute Institutional Stay (SKILLED_NURSING_FACILITY): Payer: Medicare Other | Admitting: Internal Medicine

## 2013-04-29 DIAGNOSIS — F29 Unspecified psychosis not due to a substance or known physiological condition: Secondary | ICD-10-CM

## 2013-04-29 DIAGNOSIS — K219 Gastro-esophageal reflux disease without esophagitis: Secondary | ICD-10-CM

## 2013-04-29 DIAGNOSIS — C549 Malignant neoplasm of corpus uteri, unspecified: Secondary | ICD-10-CM

## 2013-04-29 DIAGNOSIS — Z853 Personal history of malignant neoplasm of breast: Secondary | ICD-10-CM

## 2013-04-29 DIAGNOSIS — C3490 Malignant neoplasm of unspecified part of unspecified bronchus or lung: Secondary | ICD-10-CM

## 2013-04-29 DIAGNOSIS — G8929 Other chronic pain: Secondary | ICD-10-CM

## 2013-04-29 DIAGNOSIS — R609 Edema, unspecified: Secondary | ICD-10-CM

## 2013-04-29 NOTE — Progress Notes (Signed)
Patient ID: [name], female   DOB: Jan 09, 1930, 79 y.o.   MRN: [id number] Location:  [facility] Provider:  Gwenith Spitz. Renato Gails, D.O., C.M.D.  Code Status:  Full code  Chief Complaint: possible move to [place]  HPI: 79 yo female with h/o mild to moderate dementia with psychosis, OCD, HTN, peripheral neuropathy, prior breast cancer on anastrazole, prior lung cancer and endometrial cancer, chronic lower extremity edema, iron deficiency edema and GERD was seen due to plans to possibly move to a new facility in [place] closer to her family.   She c/o her usual swelling of her feet, but is wearing the compression hose (does not like them b/c they are hard to put on, but has moderate edema w/o them).  She also has some chronic pain (neuropathic in her feet).  Review of Systems:  Review of Systems  Constitutional: Negative for fever, chills, weight loss and malaise/fatigue.  HENT: Negative for congestion.   Eyes: Negative for blurred vision.  Respiratory: Negative for cough, sputum production and shortness of breath.   Cardiovascular: Positive for leg swelling. Negative for chest pain.  Gastrointestinal: Negative for heartburn.  Genitourinary: Negative for dysuria.  Musculoskeletal: Positive for back pain. Negative for myalgias and falls.  Skin: Negative for rash.  Neurological: Positive for sensory change. Negative for dizziness, weakness and headaches.       Of feet  Psychiatric/Behavioral: Positive for memory loss. Negative for depression.       OCD     Medications: Patient's Medications  New Prescriptions   No medications on file  Previous Medications   ACETAMINOPHEN (TYLENOL) 325 MG TABLET    Take 650 mg by mouth every 4 (four) hours as needed. Pain.   ANASTROZOLE (ARIMIDEX) 1 MG TABLET    Take 1 mg by mouth daily.   BETA CAROTENE W/MINERALS (OCUVITE) TABLET    Take 1 tablet by mouth daily.   BUSPIRONE (BUSPAR) 10 MG TABLET    Take 10 mg by mouth 2 (two) times  daily.   CARBAMIDE PEROXIDE (DEBROX) 6.5 % OTIC SOLUTION    5 drops daily.   CHOLECALCIFEROL (VITAMIN D) 1000 UNITS TABLET    Take 1,000 Units by mouth daily.   DIVALPROEX (DEPAKOTE SPRINKLE) 125 MG CAPSULE    Take 250-375 mg by mouth 2 (two) times daily. Take 3 capsules every morning and 2 capsules every evening.   DONEPEZIL (ARICEPT) 10 MG TABLET    Take 10 mg by mouth at bedtime.     FERROUS SULFATE (FERROUSUL PO)    Take by mouth 2 (two) times daily.   FUROSEMIDE (LASIX) 40 MG TABLET    Take 40 mg by mouth daily.    GABAPENTIN (NEURONTIN) 600 MG TABLET    Take 600 mg by mouth daily.   HYDROXYZINE (ATARAX/VISTARIL) 25 MG TABLET    Take 25 mg by mouth 3 (three) times daily as needed. Itching.   MENTHOL, TOPICAL ANALGESIC, (BIOFREEZE ROLL-ON) 4 % GEL    Apply topically. Ever 6 hrs   METOPROLOL TARTRATE (LOPRESSOR) 25 MG TABLET    Take 25 mg by mouth daily.     MUPIROCIN CREAM (BACTROBAN) 2 %    Apply topically 2 (two) times daily. Apply to left ear canal 2 times a day for the next 7 days   OMEPRAZOLE (PRILOSEC) 20 MG CAPSULE    Take 20 mg by mouth daily.   OXYCODONE-ACETAMINOPHEN (PERCOCET/ROXICET) 5-325 MG PER TABLET    Take 1 tablet once a day.  Take 2 tablets at bedtime. Take 1 tablet every 12 hours as needed for pain.   QUETIAPINE (SEROQUEL) 50 MG TABLET    Take 50 mg by mouth at bedtime.   SODIUM CHLORIDE (OCEAN) 0.65 % NASAL SPRAY    Place 1 spray into the nose at bedtime.   TRAMADOL (ULTRAM) 50 MG TABLET    Take 50 mg by mouth every 6 (six) hours as needed for pain.  Modified Medications   No medications on file  Discontinued Medications   DEXTROMETHORPHAN-GUAIFENESIN (ROBITUSSIN-DM) 10-100 MG/5ML LIQUID    Take 5 mLs by mouth every 4 (four) hours as needed for cough.   OSELTAMIVIR (TAMIFLU) 75 MG CAPSULE    Take 75 mg by mouth daily.    Physical Exam: There were no vitals filed for this visit. Physical Exam  Constitutional: She appears well-developed and well-nourished. No  distress.  HENT:  Head: Normocephalic and atraumatic.  Right Ear: External ear normal.  Left Ear: External ear normal.  Nose: Nose normal.  Mouth/Throat: Oropharynx is clear and moist. No oropharyngeal exudate.  Eyes: Conjunctivae and EOM are normal. Pupils are equal, round, and reactive to light. No scleral icterus.  Wears assorted reading glasses  Neck: Normal range of motion. No tracheal deviation present. No thyromegaly present.  Cardiovascular: Normal rate, regular rhythm, normal heart sounds and intact distal pulses.   No murmur heard. Pulmonary/Chest: Effort normal and breath sounds normal. She has no rales.  Abdominal: Soft. Bowel sounds are normal. She exhibits no distension. There is no tenderness.  Musculoskeletal: Normal range of motion. She exhibits edema. She exhibits no tenderness.  Neurological: She is alert. No cranial nerve deficit.  Oriented to person and place, not to time  Skin: Skin is warm and dry. No rash noted. No erythema. No pallor.  Psychiatric: Her affect is labile. Cognition and memory are impaired.  Anxious, obsesses over edema of feet and lower legs, wearing compression hose now    Labs reviewed: Basic Metabolic Panel:  Recent Labs  16/10/96 0919 03/18/13 1134  NA 144 145  K 3.3* 3.5  CL 99 104  CO2 33* 31*  GLUCOSE 85 76  BUN 22.0 16.9  CREATININE 0.9 0.8  CALCIUM 9.5 8.8    Liver Function Tests:  Recent Labs  09/10/12 0919 03/18/13 1134  AST 19 15  ALT 21 16  ALKPHOS 117 88  BILITOT 0.46 0.54  PROT 6.5 6.1*  ALBUMIN 3.2* 3.3*    CBC:  Recent Labs  09/10/12 0919 03/18/13 1147  WBC 4.7 5.4  NEUTROABS 2.6 3.0  HGB 13.9 13.8  HCT 41.4 40.6  MCV 93.0 94.5  PLT 195 164  facility labs reviewed and visit with oncology from June  Assessment/Plan ADENOCARCINOMA, BREAST, BILATERAL, HX OF Repeat CT chest, abdomen, pelvis in 6 mos (from June) and f/u with Dr. Arbutus Ped unless [place] has moved [facility] will help her arrange to see  someone there.  She is to continue her arimidex.    Psychosis No recent evidence of this.  Is on depakote for mood stabilization of her bipolar and this may also be helping her OCD.    GERD (gastroesophageal reflux disease) Stable, cont omeprazole.  Edema Continue compression hose indefinitely.  She asks weekly if they can be stopped, but her legs swell immediately when they are discontinued.    Chronic pain Generalized osteoarthritis and peripheral neuropathy of legs (likely from cancer treatments).  Continues on tramadol with percocet for breakthrough.  ADENOCARCINOMA, ENDOMETRIUM Stable per  CT chest/abd/pelvis w/o progression.  Follows with Dr. Arbutus Ped.  ADENOCARCINOMA, LUNG No progression on CT chest, abd/pelvis and when seen by oncology in June.  Functional status:  Pt is independent in ADLs, able to ambulate with walker, has some confusion and obsesses over things at times  Family/ staff Communication:discussed with social work and pt's nurse  Labs/tests ordered:  No new today

## 2013-05-06 ENCOUNTER — Encounter: Payer: Self-pay | Admitting: Internal Medicine

## 2013-05-06 NOTE — Assessment & Plan Note (Signed)
Stable , co'n't omeprazole ?

## 2013-05-06 NOTE — Assessment & Plan Note (Signed)
No progression on CT chest, abd/pelvis and when seen by oncology in June.

## 2013-05-06 NOTE — Assessment & Plan Note (Signed)
Continue compression hose indefinitely.  She asks weekly if they can be stopped, but her legs swell immediately when they are discontinued.

## 2013-05-06 NOTE — Assessment & Plan Note (Addendum)
Generalized osteoarthritis and peripheral neuropathy of legs (likely from cancer treatments).  Continues on tramadol with percocet for breakthrough.

## 2013-05-06 NOTE — Assessment & Plan Note (Signed)
Repeat CT chest, abdomen, pelvis in 6 mos (from June) and f/u with Dr. Arbutus Ped unless pt has moved [facility]-he will help her arrange to see someone there.  She is to continue her arimidex.

## 2013-05-06 NOTE — Assessment & Plan Note (Signed)
Stable per CT chest/abd/pelvis w/o progression.  Follows with Dr. Arbutus Ped.

## 2013-05-06 NOTE — Assessment & Plan Note (Signed)
No recent evidence of this.  Is on depakote for mood stabilization of her bipolar and this may also be helping her OCD.

## 2013-05-20 ENCOUNTER — Encounter (HOSPITAL_COMMUNITY): Payer: Self-pay | Admitting: Emergency Medicine

## 2013-05-20 ENCOUNTER — Emergency Department (HOSPITAL_COMMUNITY): Payer: Medicare Other

## 2013-05-20 ENCOUNTER — Inpatient Hospital Stay (HOSPITAL_COMMUNITY)
Admission: EM | Admit: 2013-05-20 | Discharge: 2013-05-23 | DRG: 481 | Disposition: A | Discharge status: Skilled Nursing Facility | Payer: Medicare Other | Attending: Internal Medicine | Admitting: Internal Medicine

## 2013-05-20 ENCOUNTER — Inpatient Hospital Stay (HOSPITAL_COMMUNITY): Payer: Medicare Other

## 2013-05-20 DIAGNOSIS — G8929 Other chronic pain: Secondary | ICD-10-CM | POA: Diagnosis present

## 2013-05-20 DIAGNOSIS — S72001S Fracture of unspecified part of neck of right femur, sequela: Secondary | ICD-10-CM

## 2013-05-20 DIAGNOSIS — W010XXA Fall on same level from slipping, tripping and stumbling without subsequent striking against object, initial encounter: Secondary | ICD-10-CM | POA: Diagnosis present

## 2013-05-20 DIAGNOSIS — F411 Generalized anxiety disorder: Secondary | ICD-10-CM | POA: Diagnosis present

## 2013-05-20 DIAGNOSIS — Y9210 Unspecified residential institution as the place of occurrence of the external cause: Secondary | ICD-10-CM | POA: Diagnosis present

## 2013-05-20 DIAGNOSIS — C50919 Malignant neoplasm of unspecified site of unspecified female breast: Secondary | ICD-10-CM | POA: Diagnosis present

## 2013-05-20 DIAGNOSIS — R634 Abnormal weight loss: Secondary | ICD-10-CM | POA: Diagnosis present

## 2013-05-20 DIAGNOSIS — S72143A Displaced intertrochanteric fracture of unspecified femur, initial encounter for closed fracture: Principal | ICD-10-CM | POA: Diagnosis present

## 2013-05-20 DIAGNOSIS — Z8542 Personal history of malignant neoplasm of other parts of uterus: Secondary | ICD-10-CM

## 2013-05-20 DIAGNOSIS — S72009A Fracture of unspecified part of neck of unspecified femur, initial encounter for closed fracture: Secondary | ICD-10-CM

## 2013-05-20 DIAGNOSIS — J449 Chronic obstructive pulmonary disease, unspecified: Secondary | ICD-10-CM | POA: Diagnosis present

## 2013-05-20 DIAGNOSIS — K219 Gastro-esophageal reflux disease without esophagitis: Secondary | ICD-10-CM | POA: Diagnosis present

## 2013-05-20 DIAGNOSIS — I10 Essential (primary) hypertension: Secondary | ICD-10-CM

## 2013-05-20 DIAGNOSIS — S72001A Fracture of unspecified part of neck of right femur, initial encounter for closed fracture: Secondary | ICD-10-CM

## 2013-05-20 DIAGNOSIS — F0390 Unspecified dementia without behavioral disturbance: Secondary | ICD-10-CM | POA: Diagnosis present

## 2013-05-20 DIAGNOSIS — C3490 Malignant neoplasm of unspecified part of unspecified bronchus or lung: Secondary | ICD-10-CM | POA: Diagnosis present

## 2013-05-20 DIAGNOSIS — J4489 Other specified chronic obstructive pulmonary disease: Secondary | ICD-10-CM | POA: Diagnosis present

## 2013-05-20 DIAGNOSIS — Z853 Personal history of malignant neoplasm of breast: Secondary | ICD-10-CM

## 2013-05-20 HISTORY — DX: Fracture of unspecified part of neck of unspecified femur, initial encounter for closed fracture: S72.009A

## 2013-05-20 LAB — CBC WITH DIFFERENTIAL/PLATELET
Basophils Absolute: 0 10*3/uL (ref 0.0–0.1)
Eosinophils Absolute: 0.1 10*3/uL (ref 0.0–0.7)
Eosinophils Relative: 1 % (ref 0–5)
HCT: 43.2 % (ref 36.0–46.0)
MCH: 31.6 pg (ref 26.0–34.0)
MCHC: 33.1 g/dL (ref 30.0–36.0)
MCV: 95.4 fL (ref 78.0–100.0)
Monocytes Absolute: 0.7 10*3/uL (ref 0.1–1.0)
Platelets: 180 10*3/uL (ref 150–400)
RDW: 12.8 % (ref 11.5–15.5)

## 2013-05-20 LAB — COMPREHENSIVE METABOLIC PANEL
ALT: 26 U/L (ref 0–35)
AST: 28 U/L (ref 0–37)
Calcium: 9.4 mg/dL (ref 8.4–10.5)
Creatinine, Ser: 0.76 mg/dL (ref 0.50–1.10)
GFR calc Af Amer: 89 mL/min — ABNORMAL LOW (ref >90)
GFR calc non Af Amer: 77 mL/min — ABNORMAL LOW (ref >90)
Sodium: 136 mEq/L (ref 135–145)
Total Protein: 6.4 g/dL (ref 6.0–8.3)

## 2013-05-20 LAB — APTT: aPTT: 29 seconds (ref 24–37)

## 2013-05-20 LAB — PROTIME-INR: INR: 0.92 (ref 0.00–1.49)

## 2013-05-20 MED ORDER — MORPHINE SULFATE 2 MG/ML IJ SOLN
0.5000 mg | INTRAMUSCULAR | Status: DC | PRN
  Administered 2013-05-20 – 2013-05-21 (×4): 0.5 mg via INTRAVENOUS
  Filled 2013-05-20 (×4): qty 1

## 2013-05-20 MED ORDER — BUSPIRONE HCL 10 MG PO TABS
10.0000 mg | ORAL_TABLET | Freq: Two times a day (BID) | ORAL | Status: DC
  Administered 2013-05-20 – 2013-05-23 (×5): 10 mg via ORAL
  Filled 2013-05-20 (×7): qty 1

## 2013-05-20 MED ORDER — MUSCLE RUB 10-15 % EX CREA
1.0000 "application " | TOPICAL_CREAM | Freq: Four times a day (QID) | CUTANEOUS | Status: DC
  Administered 2013-05-21 – 2013-05-23 (×7): 1 via TOPICAL
  Filled 2013-05-20: qty 85

## 2013-05-20 MED ORDER — FENTANYL CITRATE 0.05 MG/ML IJ SOLN
50.0000 ug | Freq: Once | INTRAMUSCULAR | Status: AC
  Administered 2013-05-20: 50 ug via INTRAVENOUS
  Filled 2013-05-20: qty 2

## 2013-05-20 MED ORDER — SODIUM CHLORIDE 0.9 % IV SOLN
INTRAVENOUS | Status: AC
  Administered 2013-05-20: 23:00:00 via INTRAVENOUS

## 2013-05-20 MED ORDER — GABAPENTIN 300 MG PO CAPS
600.0000 mg | ORAL_CAPSULE | Freq: Every day | ORAL | Status: DC
  Administered 2013-05-21 – 2013-05-23 (×2): 600 mg via ORAL
  Filled 2013-05-20 (×3): qty 2

## 2013-05-20 MED ORDER — DIVALPROEX SODIUM 125 MG PO CPSP
250.0000 mg | ORAL_CAPSULE | ORAL | Status: DC
  Administered 2013-05-22 – 2013-05-23 (×2): 250 mg via ORAL
  Filled 2013-05-20 (×3): qty 2

## 2013-05-20 MED ORDER — SODIUM CHLORIDE 0.9 % IV SOLN: INTRAVENOUS | Status: DC

## 2013-05-20 MED ORDER — HYDROXYZINE HCL 25 MG PO TABS
25.0000 mg | ORAL_TABLET | Freq: Three times a day (TID) | ORAL | Status: DC | PRN
  Filled 2013-05-20: qty 1

## 2013-05-20 MED ORDER — VITAMIN D3 25 MCG (1000 UNIT) PO TABS
1000.0000 [IU] | ORAL_TABLET | Freq: Every day | ORAL | Status: DC
  Administered 2013-05-21 – 2013-05-23 (×2): 1000 [IU] via ORAL
  Filled 2013-05-20 (×4): qty 1

## 2013-05-20 MED ORDER — DIVALPROEX SODIUM 125 MG PO CPSP
375.0000 mg | ORAL_CAPSULE | Freq: Every day | ORAL | Status: DC
  Administered 2013-05-21 – 2013-05-23 (×3): 375 mg via ORAL
  Filled 2013-05-20 (×3): qty 3

## 2013-05-20 MED ORDER — FERROUS SULFATE 325 (65 FE) MG PO TABS
325.0000 mg | ORAL_TABLET | Freq: Two times a day (BID) | ORAL | Status: DC
  Administered 2013-05-21 – 2013-05-23 (×2): 325 mg via ORAL
  Filled 2013-05-20 (×7): qty 1

## 2013-05-20 MED ORDER — HYDROCODONE-ACETAMINOPHEN 5-325 MG PO TABS
1.0000 | ORAL_TABLET | Freq: Four times a day (QID) | ORAL | Status: DC | PRN
  Administered 2013-05-20 – 2013-05-21 (×2): 1 via ORAL
  Administered 2013-05-21: 2 via ORAL
  Filled 2013-05-20: qty 1
  Filled 2013-05-20: qty 2
  Filled 2013-05-20: qty 1

## 2013-05-20 MED ORDER — DONEPEZIL HCL 10 MG PO TABS
10.0000 mg | ORAL_TABLET | Freq: Every day | ORAL | Status: DC
  Administered 2013-05-21 – 2013-05-22 (×3): 10 mg via ORAL
  Filled 2013-05-20 (×5): qty 1

## 2013-05-20 MED ORDER — METOPROLOL TARTRATE 25 MG PO TABS
25.0000 mg | ORAL_TABLET | Freq: Every day | ORAL | Status: DC
  Administered 2013-05-21 – 2013-05-23 (×2): 25 mg via ORAL
  Filled 2013-05-20 (×3): qty 1

## 2013-05-20 MED ORDER — ANASTROZOLE 1 MG PO TABS
1.0000 mg | ORAL_TABLET | Freq: Every day | ORAL | Status: DC
  Administered 2013-05-21 – 2013-05-23 (×2): 1 mg via ORAL
  Filled 2013-05-20 (×4): qty 1

## 2013-05-20 MED ORDER — OCUVITE PO TABS
1.0000 | ORAL_TABLET | Freq: Every day | ORAL | Status: DC
  Administered 2013-05-21 – 2013-05-23 (×2): 1 via ORAL
  Filled 2013-05-20 (×3): qty 1

## 2013-05-20 NOTE — H&P (Signed)
[facility] History and Physical  [name] ZOX:[id number] DOB: 1930-06-29 DOA: 05/20/2013  Referring physician: ER physician. PCP: Bufford Spikes, DO   Chief Complaint: Fall with right hip pain.  HPI: [name] is a 79 y.o. female history of hypertension, lung cancer and breast cancer being followed by Dr. Arbutus Ped, COPD had a fall at her nursing home when she was trying to get up after using the commode. Patient denies having was consciousness or hit her head. Denies any chest pain palpitation or shortness of breath. In the ER x-rays revealed right hip fracture and Dr. Ophelia Charter was consulted. Patient has been admitted for further management.   Review of Systems: As presented in the history of presenting illness, rest negative.  Past Medical History  Diagnosis Date  . Cancer   . Hypertension   . Dementia   . COPD (chronic obstructive pulmonary disease)   . Peripheral neuropathy    Past Surgical History  Procedure Laterality Date  . Breast lumpectomy    . Lung cancer surgery    . Uterine cancer     Social History:  reports that she has never smoked. She has never used smokeless tobacco. She reports that  drinks alcohol. She reports that she does not use illicit drugs. Nursing home. where does patient live-- Can do ADLs. Can patient participate in ADLs?  Allergies  Allergen Reactions  . Abilify (Aripiprazole)     History reviewed. No pertinent family history.    Prior to Admission medications   Medication Sig Start Date End Date Taking? Authorizing Provider  anastrozole (ARIMIDEX) 1 MG tablet Take 1 mg by mouth daily.   Yes Historical Provider, MD  beta carotene w/minerals (OCUVITE) tablet Take 1 tablet by mouth daily.   Yes Historical Provider, MD  busPIRone (BUSPAR) 10 MG tablet Take 10 mg by mouth 2 (two) times daily.   Yes Historical Provider, MD  cholecalciferol (VITAMIN D) 1000 UNITS tablet Take 1,000 Units by mouth daily.   Yes Historical Provider, MD   divalproex (DEPAKOTE SPRINKLE) 125 MG capsule Take 250-375 mg by mouth 2 (two) times daily. Take 3 capsules every morning and 2 capsules every evening.   Yes Historical Provider, MD  donepezil (ARICEPT) 10 MG tablet Take 10 mg by mouth at bedtime.     Yes Historical Provider, MD  ferrous sulfate 325 (65 FE) MG tablet Take 325 mg by mouth 2 (two) times daily.   Yes Historical Provider, MD  furosemide (LASIX) 40 MG tablet Take 40 mg by mouth 2 (two) times daily.    Yes Historical Provider, MD  gabapentin (NEURONTIN) 600 MG tablet Take 600 mg by mouth daily.   Yes Historical Provider, MD  hydrOXYzine (ATARAX/VISTARIL) 25 MG tablet Take 25 mg by mouth 3 (three) times daily as needed. Itching.   Yes Historical Provider, MD  Menthol, Topical Analgesic, (BIOFREEZE ROLL-ON) 4 % GEL Apply 1 application topically every 6 (six) hours. Ever 6 hrs   Yes Historical Provider, MD  metoprolol tartrate (LOPRESSOR) 25 MG tablet Take 25 mg by mouth daily.     Yes Historical Provider, MD  oxyCODONE-acetaminophen (PERCOCET/ROXICET) 5-325 MG per tablet Take 1 tablet once a day. Take 2 tablets at bedtime. Take 1 tablet every 12 hours as needed for pain. 03/04/13  Yes Kimber Relic, MD  potassium chloride SA (K-DUR,KLOR-CON) 20 MEQ tablet Take 20 mEq by mouth daily.   Yes Historical Provider, MD   Physical Exam: Filed Vitals:   05/20/13 2054  BP: 123/84  Pulse: 78  Temp: 97.7 F (36.5 C)  TempSrc: Oral  Resp: 16  SpO2: 100%     General:  Well-developed and moderately nourished.  Eyes: Anicteric no pallor.  ENT: No discharge from the ears eyes nose mouth.  Neck: No mass felt.  Cardiovascular: S1-S2 heard.  Respiratory: No rhonchi or crepitations.  Abdomen: Soft nontender bowel sounds present  Skin: No rash.  Musculoskeletal: Pain on moving right hip.  Psychiatric: Appears normal.  Neurologic: Alert and oriented to name and place. Moves all extremities.  Labs on Admission:  Basic Metabolic  Panel:  Recent Labs Lab 05/20/13 2105  NA 136  K 3.6  CL 94*  CO2 30  GLUCOSE 105*  BUN 13  CREATININE 0.76  CALCIUM 9.4   Liver Function Tests:  Recent Labs Lab 05/20/13 2105  AST 28  ALT 26  ALKPHOS 86  BILITOT 0.3  PROT 6.4  ALBUMIN 3.4*   No results found for this basename: LIPASE, AMYLASE,  in the last 168 hours No results found for this basename: AMMONIA,  in the last 168 hours CBC:  Recent Labs Lab 05/20/13 2105  WBC 7.3  NEUTROABS 4.9  HGB 14.3  HCT 43.2  MCV 95.4  PLT 180   Cardiac Enzymes: No results found for this basename: CKTOTAL, CKMB, CKMBINDEX, TROPONINI,  in the last 168 hours  BNP (last 3 results) No results found for this basename: PROBNP,  in the last 8760 hours CBG: No results found for this basename: GLUCAP,  in the last 168 hours  Radiological Exams on Admission: Dg Hip Complete Right  05/20/2013   *RADIOLOGY REPORT*  Clinical Data: Fall, right hip pain  RIGHT HIP - COMPLETE 2+ VIEW  Comparison: None.  Findings: There is an acute displaced and angulated right hip intertrochanteric fracture.  Bones are osteopenic.  Degenerative changes of the lumbar spine and left hip as well.  Pelvis appears intact.  Postop changes throughout the pelvis bilaterally. Nonobstructive bowel gas pattern.  IMPRESSION: Acute right hip intertrochanteric fracture.   Original Report Authenticated By: Judie Petit. Miles Costain, M.D.    EKG: Independently reviewed. Normal sinus rhythm with ST changes in the V3 and V4.  Assessment/Plan Principal Problem:   Hip fracture Active Problems:   ANXIETY   HYPERTENSION   Chronic pain   1. Right hip fracture - status post mechanical fall - patient denies any chest pain or shortness of breath. From medical standpoint of view patient looks stable for surgery. Orthopedic is planning to keep patient n.p.o. after breakfast in preparation of surgery. Continue with pain relief medications. 2. History of hypertension - until surgery is over  hold Lasix. Continue beta blockers. 3. COPD - presently not wheezing. Continue inhalers. Chest x-ray is pending. 4. History of anxiety - continue present medications. 5. History of lung cancer breast cancer and endometrial cancer - being followed by oncologist. 6. History of dementia - no acute issues. 7. Chronic pain.    Code Status: Full code.  Family Communication: None.  Disposition Plan: Admit to inpatient.    Ophie Burrowes N. [facility] Pager [phone].  If 7PM-7AM, please contact night-coverage [email or web address] Password [email or web address] 05/20/2013, 10:59 PM

## 2013-05-20 NOTE — ED Notes (Addendum)
Per EMS: Pt is from [facility]. Pt reports falling while ambulating to the bathroom, fall was unwitneessed. Pt denies hitting her head, LOC, or being on blood thinning medication. Upon arrival to facility, EMS reports that pt was assisted back into her bed. Pt reports right sided hip pain. EMS reports that [facility] gave two percocet after the fall at 2000.Pt unable to straighten leg, as this worsens the hip pain.

## 2013-05-20 NOTE — ED Provider Notes (Signed)
CSN: [id number]     Arrival date & time 05/20/13  2045 History     First MD Initiated Contact with Patient 05/20/13 2055     Chief Complaint  Patient presents with  . Fall  . Hip Pain    Right    (Consider location/radiation/quality/duration/timing/severity/associated sxs/prior Treatment) HPI Pt at assisted living facility states she was standing up from toilet and fell landing on her R hip. Pt was unable to get up. She was lifted to bed but has been unable to weight bear since. C/o pain to right hip radiating to groin. No new numbness. No head or neck injury.  Past Medical History  Diagnosis Date  . Cancer   . Hypertension   . Dementia   . COPD (chronic obstructive pulmonary disease)   . Peripheral neuropathy    Past Surgical History  Procedure Laterality Date  . Breast lumpectomy    . Lung cancer surgery    . Uterine cancer     No family history on file. History  Substance Use Topics  . Smoking status: Never Smoker   . Smokeless tobacco: Never Used  . Alcohol Use: Yes     Comment: GLASS RED WINE   OB History   Grav Para Term Preterm Abortions TAB SAB Ect Mult Living                 Review of Systems  Constitutional: Negative for fever and chills.  HENT: Negative for neck pain and neck stiffness.   Respiratory: Negative for cough and shortness of breath.   Cardiovascular: Negative for chest pain.  Gastrointestinal: Negative for nausea, vomiting, abdominal pain, diarrhea and constipation.  Genitourinary: Negative for dysuria.  Musculoskeletal: Positive for arthralgias. Negative for myalgias and back pain.  Skin: Negative for rash and wound.  Neurological: Positive for dizziness. Negative for syncope, weakness, light-headedness, numbness and headaches.  All other systems reviewed and are negative.    Allergies  Abilify  Home Medications   Current Outpatient Rx  Name  Route  Sig  Dispense  Refill  . anastrozole (ARIMIDEX) 1 MG tablet   Oral   Take 1 mg by  mouth daily.         . beta carotene w/minerals (OCUVITE) tablet   Oral   Take 1 tablet by mouth daily.         . busPIRone (BUSPAR) 10 MG tablet   Oral   Take 10 mg by mouth 2 (two) times daily.         . cholecalciferol (VITAMIN D) 1000 UNITS tablet   Oral   Take 1,000 Units by mouth daily.         . divalproex (DEPAKOTE SPRINKLE) 125 MG capsule   Oral   Take 250-375 mg by mouth 2 (two) times daily. Take 3 capsules every morning and 2 capsules every evening.         . donepezil (ARICEPT) 10 MG tablet   Oral   Take 10 mg by mouth at bedtime.           . ferrous sulfate 325 (65 FE) MG tablet   Oral   Take 325 mg by mouth 2 (two) times daily.         . furosemide (LASIX) 40 MG tablet   Oral   Take 40 mg by mouth 2 (two) times daily.          Marland Kitchen gabapentin (NEURONTIN) 600 MG tablet   Oral   Take 600 mg  by mouth daily.         . hydrOXYzine (ATARAX/VISTARIL) 25 MG tablet   Oral   Take 25 mg by mouth 3 (three) times daily as needed. Itching.         . Menthol, Topical Analgesic, (BIOFREEZE ROLL-ON) 4 % GEL   Apply externally   Apply 1 application topically every 6 (six) hours. Ever 6 hrs         . metoprolol tartrate (LOPRESSOR) 25 MG tablet   Oral   Take 25 mg by mouth daily.           Marland Kitchen oxyCODONE-acetaminophen (PERCOCET/ROXICET) 5-325 MG per tablet      Take 1 tablet once a day. Take 2 tablets at bedtime. Take 1 tablet every 12 hours as needed for pain.   150 tablet   0   . potassium chloride SA (K-DUR,KLOR-CON) 20 MEQ tablet   Oral   Take 20 mEq by mouth daily.          BP 123/84  Pulse 78  Temp(Src) 97.7 F (36.5 C) (Oral)  Resp 16  SpO2 100% Physical Exam  Nursing note and vitals reviewed. Constitutional: She is oriented to person, place, and time. She appears well-developed and well-nourished. No distress.  HENT:  Head: Normocephalic and atraumatic.  Mouth/Throat: Oropharynx is clear and moist.  Eyes: EOM are normal.  Pupils are equal, round, and reactive to light.  Neck: Normal range of motion. Neck supple.  No posterior cervical tenderness  Cardiovascular: Normal rate and regular rhythm.   Pulmonary/Chest: Effort normal and breath sounds normal. No respiratory distress. She has no wheezes. She has no rales.  Abdominal: Soft. Bowel sounds are normal. She exhibits no distension and no mass. There is no tenderness. There is no rebound and no guarding.  Musculoskeletal: She exhibits no edema and no tenderness.  Pt with TTP over R hip and decreased ROM due to pain. Pt keeps R hip flexed. No knee or ankle pain. 2+ bl DP  Neurological: She is alert and oriented to person, place, and time.  Decreased bl lower ext sensation due to prev neuropathy without acute changes. EHL/FHL intact bl.   Skin: Skin is warm and dry. No rash noted. No erythema.  Psychiatric: She has a normal mood and affect. Her behavior is normal.    ED Course   Procedures (including critical care time)  Labs Reviewed  COMPREHENSIVE METABOLIC PANEL - Abnormal; Notable for the following:    Chloride 94 (*)    Glucose, Bld 105 (*)    Albumin 3.4 (*)    GFR calc non Af Amer 77 (*)    GFR calc Af Amer 89 (*)    All other components within normal limits  CBC WITH DIFFERENTIAL  PROTIME-INR  APTT  URINALYSIS, ROUTINE W REFLEX MICROSCOPIC   Dg Hip Complete Right  05/20/2013   *RADIOLOGY REPORT*  Clinical Data: Fall, right hip pain  RIGHT HIP - COMPLETE 2+ VIEW  Comparison: None.  Findings: There is an acute displaced and angulated right hip intertrochanteric fracture.  Bones are osteopenic.  Degenerative changes of the lumbar spine and left hip as well.  Pelvis appears intact.  Postop changes throughout the pelvis bilaterally. Nonobstructive bowel gas pattern.  IMPRESSION: Acute right hip intertrochanteric fracture.   Original Report Authenticated By: Judie Petit. Shick, M.D.   1. Closed right hip fracture, initial encounter     Date: 05/20/2013   Rate: 95  Rhythm: normal sinus rhythm  QRS Axis:  normal  Intervals: normal  ST/T Wave abnormalities: nonspecific T wave changes  Conduction Disutrbances:none  Narrative Interpretation:   Old EKG Reviewed: Poor quality with wandering baseline   MDM  Discussed with Dr Ophelia Charter. Will see in AM. Suggests 5 lbs bucks traction to RLE. Clear liquid breakfast in AM and NPO after. Will plan to repair tomorrow.  Relay to [facility]. Will see in ED and admit.   Loren Racer, MD 05/20/13 2229

## 2013-05-21 ENCOUNTER — Inpatient Hospital Stay (HOSPITAL_COMMUNITY): Payer: Medicare Other

## 2013-05-21 ENCOUNTER — Encounter (HOSPITAL_COMMUNITY)
Admission: EM | Disposition: A | Discharge status: Skilled Nursing Facility | Payer: Self-pay | Source: Home / Self Care | Attending: Internal Medicine

## 2013-05-21 ENCOUNTER — Encounter (HOSPITAL_COMMUNITY): Payer: Self-pay | Admitting: Anesthesiology

## 2013-05-21 ENCOUNTER — Inpatient Hospital Stay (HOSPITAL_COMMUNITY): Payer: Medicare Other | Admitting: Anesthesiology

## 2013-05-21 DIAGNOSIS — F0390 Unspecified dementia without behavioral disturbance: Secondary | ICD-10-CM

## 2013-05-21 DIAGNOSIS — F411 Generalized anxiety disorder: Secondary | ICD-10-CM

## 2013-05-21 HISTORY — PX: FEMUR IM NAIL: SHX1597

## 2013-05-21 LAB — BASIC METABOLIC PANEL
BUN: 13 mg/dL (ref 6–23)
Calcium: 9 mg/dL (ref 8.4–10.5)
GFR calc Af Amer: >90 mL/min (ref >90)
GFR calc non Af Amer: 78 mL/min — ABNORMAL LOW (ref >90)
Glucose, Bld: 120 mg/dL — ABNORMAL HIGH (ref 70–99)
Sodium: 137 mEq/L (ref 135–145)

## 2013-05-21 LAB — CBC
Hemoglobin: 13.7 g/dL (ref 12.0–15.0)
MCH: 31.8 pg (ref 26.0–34.0)
MCHC: 33.8 g/dL (ref 30.0–36.0)
RDW: 12.7 % (ref 11.5–15.5)

## 2013-05-21 LAB — URINALYSIS, ROUTINE W REFLEX MICROSCOPIC
Bilirubin Urine: NEGATIVE
Glucose, UA: NEGATIVE mg/dL
Hgb urine dipstick: NEGATIVE
Ketones, ur: NEGATIVE mg/dL
Leukocytes, UA: NEGATIVE
Nitrite: NEGATIVE
Protein, ur: NEGATIVE mg/dL
Specific Gravity, Urine: 1.015 (ref 1.005–1.030)
Urobilinogen, UA: 0.2 mg/dL (ref 0.0–1.0)
pH: 8 (ref 5.0–8.0)

## 2013-05-21 SURGERY — INSERTION, INTRAMEDULLARY ROD, FEMUR
Anesthesia: General | Site: Hip | Laterality: Right | Wound class: Clean

## 2013-05-21 MED ORDER — PROPOFOL 10 MG/ML IV BOLUS
INTRAVENOUS | Status: DC | PRN
  Administered 2013-05-21 (×2): 20 mg via INTRAVENOUS

## 2013-05-21 MED ORDER — MORPHINE SULFATE 2 MG/ML IJ SOLN
0.5000 mg | INTRAMUSCULAR | Status: DC | PRN
  Administered 2013-05-22 (×4): 1 mg via INTRAVENOUS
  Filled 2013-05-21 (×4): qty 1

## 2013-05-21 MED ORDER — MENTHOL 3 MG MT LOZG: 1.0000 | LOZENGE | OROMUCOSAL | Status: DC | PRN

## 2013-05-21 MED ORDER — SODIUM CHLORIDE 0.9 % IV SOLN
INTRAVENOUS | Status: DC
  Administered 2013-05-21: 75 mL/h via INTRAVENOUS
  Administered 2013-05-22: 23:00:00 via INTRAVENOUS

## 2013-05-21 MED ORDER — HYDROCODONE-ACETAMINOPHEN 5-325 MG PO TABS
1.0000 | ORAL_TABLET | Freq: Four times a day (QID) | ORAL | Status: DC | PRN
  Administered 2013-05-21 (×2): 1 via ORAL
  Administered 2013-05-22 – 2013-05-23 (×4): 2 via ORAL
  Filled 2013-05-21: qty 1
  Filled 2013-05-21 (×3): qty 2
  Filled 2013-05-21: qty 1
  Filled 2013-05-21: qty 2
  Filled 2013-05-21: qty 1

## 2013-05-21 MED ORDER — PHENOL 1.4 % MT LIQD: 1.0000 | OROMUCOSAL | Status: DC | PRN

## 2013-05-21 MED ORDER — METOCLOPRAMIDE HCL 5 MG/ML IJ SOLN
5.0000 mg | Freq: Three times a day (TID) | INTRAMUSCULAR | Status: DC | PRN
  Administered 2013-05-21 – 2013-05-22 (×2): 10 mg via INTRAVENOUS
  Filled 2013-05-21 (×2): qty 2

## 2013-05-21 MED ORDER — MORPHINE SULFATE 2 MG/ML IJ SOLN
2.0000 mg | INTRAMUSCULAR | Status: DC | PRN
  Administered 2013-05-21 (×3): 2 mg via INTRAVENOUS
  Filled 2013-05-21 (×3): qty 1

## 2013-05-21 MED ORDER — WARFARIN SODIUM 4 MG PO TABS
4.0000 mg | ORAL_TABLET | Freq: Once | ORAL | Status: AC
  Administered 2013-05-21: 4 mg via ORAL
  Filled 2013-05-21: qty 1

## 2013-05-21 MED ORDER — FENTANYL CITRATE 0.05 MG/ML IJ SOLN
INTRAMUSCULAR | Status: DC | PRN
  Administered 2013-05-21 (×2): 50 ug via INTRAVENOUS

## 2013-05-21 MED ORDER — WARFARIN - PHARMACIST DOSING INPATIENT: Freq: Every day | Status: DC

## 2013-05-21 MED ORDER — COUMADIN BOOK
Freq: Once | Status: DC
  Filled 2013-05-21: qty 1

## 2013-05-21 MED ORDER — BISACODYL 10 MG RE SUPP: 10.0000 mg | Freq: Every day | RECTAL | Status: DC | PRN

## 2013-05-21 MED ORDER — LACTATED RINGERS IV SOLN
INTRAVENOUS | Status: DC | PRN
  Administered 2013-05-21: 17:00:00 via INTRAVENOUS

## 2013-05-21 MED ORDER — PROPOFOL INFUSION 10 MG/ML OPTIME
INTRAVENOUS | Status: DC | PRN
  Administered 2013-05-21: 50 ug/kg/min via INTRAVENOUS

## 2013-05-21 MED ORDER — ONDANSETRON HCL 4 MG/2ML IJ SOLN
INTRAMUSCULAR | Status: DC | PRN
  Administered 2013-05-21: 4 mg via INTRAVENOUS

## 2013-05-21 MED ORDER — LACTATED RINGERS IV SOLN: INTRAVENOUS | Status: DC

## 2013-05-21 MED ORDER — FLEET ENEMA 7-19 GM/118ML RE ENEM: 1.0000 | ENEMA | Freq: Once | RECTAL | Status: AC | PRN

## 2013-05-21 MED ORDER — 0.9 % SODIUM CHLORIDE (POUR BTL) OPTIME
TOPICAL | Status: DC | PRN
  Administered 2013-05-21: 1000 mL

## 2013-05-21 MED ORDER — MORPHINE SULFATE 2 MG/ML IJ SOLN
0.5000 mg | INTRAMUSCULAR | Status: DC | PRN
  Administered 2013-05-21: 0.5 mg via INTRAVENOUS
  Filled 2013-05-21: qty 1

## 2013-05-21 MED ORDER — BUPIVACAINE HCL (PF) 0.5 % IJ SOLN
INTRAMUSCULAR | Status: DC | PRN
  Administered 2013-05-21: 10 mL

## 2013-05-21 MED ORDER — ONDANSETRON HCL 4 MG/2ML IJ SOLN
4.0000 mg | Freq: Four times a day (QID) | INTRAMUSCULAR | Status: DC | PRN
  Administered 2013-05-22 (×2): 4 mg via INTRAVENOUS
  Filled 2013-05-21 (×2): qty 2

## 2013-05-21 MED ORDER — ACETAMINOPHEN 650 MG RE SUPP: 650.0000 mg | Freq: Four times a day (QID) | RECTAL | Status: DC | PRN

## 2013-05-21 MED ORDER — CEFAZOLIN SODIUM-DEXTROSE 2-3 GM-% IV SOLR
INTRAVENOUS | Status: DC | PRN
  Administered 2013-05-21: 2 g via INTRAVENOUS

## 2013-05-21 MED ORDER — POLYETHYLENE GLYCOL 3350 17 G PO PACK: 17.0000 g | PACK | Freq: Every day | ORAL | Status: DC | PRN

## 2013-05-21 MED ORDER — METOCLOPRAMIDE HCL 10 MG PO TABS
5.0000 mg | ORAL_TABLET | Freq: Three times a day (TID) | ORAL | Status: DC | PRN
  Administered 2013-05-22: 10 mg via ORAL
  Filled 2013-05-21: qty 1

## 2013-05-21 MED ORDER — ONDANSETRON HCL 4 MG PO TABS
4.0000 mg | ORAL_TABLET | Freq: Four times a day (QID) | ORAL | Status: DC | PRN
  Administered 2013-05-23: 4 mg via ORAL
  Filled 2013-05-21: qty 1

## 2013-05-21 MED ORDER — DOCUSATE SODIUM 100 MG PO CAPS
100.0000 mg | ORAL_CAPSULE | Freq: Two times a day (BID) | ORAL | Status: DC
  Administered 2013-05-21 – 2013-05-23 (×3): 100 mg via ORAL

## 2013-05-21 MED ORDER — ACETAMINOPHEN 325 MG PO TABS: 650.0000 mg | ORAL_TABLET | Freq: Four times a day (QID) | ORAL | Status: DC | PRN

## 2013-05-21 MED ORDER — FENTANYL CITRATE 0.05 MG/ML IJ SOLN
25.0000 ug | INTRAMUSCULAR | Status: DC | PRN
  Administered 2013-05-21: 12.5 ug via INTRAVENOUS

## 2013-05-21 MED ORDER — EPHEDRINE SULFATE 50 MG/ML IJ SOLN
INTRAMUSCULAR | Status: DC | PRN
  Administered 2013-05-21 (×2): 5 mg via INTRAVENOUS

## 2013-05-21 MED ORDER — WARFARIN VIDEO: Freq: Once | Status: DC

## 2013-05-21 SURGICAL SUPPLY — 35 items
BAG ZIPLOCK 12X15 (MISCELLANEOUS) ×2 IMPLANT
BLADE SURG 15 STRL LF DISP TIS (BLADE) IMPLANT
BLADE SURG 15 STRL SS (BLADE)
BNDG COHESIVE 4X5 TAN STRL (GAUZE/BANDAGES/DRESSINGS) IMPLANT
CLOTH BEACON ORANGE TIMEOUT ST (SAFETY) ×2 IMPLANT
DRAPE STERI IOBAN 125X83 (DRAPES) ×2 IMPLANT
DRAPE TABLE BACK 44X90 PK DISP (DRAPES) ×2 IMPLANT
DRSG PAD ABDOMINAL 8X10 ST (GAUZE/BANDAGES/DRESSINGS) ×2 IMPLANT
DURAPREP 26ML APPLICATOR (WOUND CARE) ×2 IMPLANT
ELECT REM PT RETURN 9FT ADLT (ELECTROSURGICAL) ×2
ELECTRODE REM PT RTRN 9FT ADLT (ELECTROSURGICAL) ×1 IMPLANT
EVACUATOR 1/8 PVC DRAIN (DRAIN) IMPLANT
EVACUATOR SILICONE 100CC (DRAIN) IMPLANT
GAUZE XEROFORM 5X9 LF (GAUZE/BANDAGES/DRESSINGS) IMPLANT
GLOVE ORTHO TXT STRL SZ7.5 (GLOVE) ×2 IMPLANT
GOWN PREVENTION PLUS LG XLONG (DISPOSABLE) ×2 IMPLANT
GUIDEPIN 3.2X17.5 THRD DISP (PIN) ×2 IMPLANT
GUIDEWIRE BALL NOSE 80CM (WIRE) ×2 IMPLANT
HIP FRA NAIL LAG SCREW 10.5X90 (Orthopedic Implant) ×2 IMPLANT
KIT BASIN OR (CUSTOM PROCEDURE TRAY) ×2 IMPLANT
NAIL HIP FRACTURE 11X380MM (Nail) ×2 IMPLANT
PACK GENERAL/GYN (CUSTOM PROCEDURE TRAY) ×2 IMPLANT
POSITIONER SURGICAL ARM (MISCELLANEOUS) ×2 IMPLANT
SCREW LAG HIP FRA NAIL 10.5X90 (Orthopedic Implant) ×1 IMPLANT
SPONGE GAUZE 4X4 12PLY (GAUZE/BANDAGES/DRESSINGS) IMPLANT
SPONGE LAP 4X18 X RAY DECT (DISPOSABLE) IMPLANT
STAPLER VISISTAT (STAPLE) ×2 IMPLANT
SUCTION FRAZIER TIP 10 FR DISP (SUCTIONS) IMPLANT
SUT ETHILON 2 0 PS N (SUTURE) IMPLANT
SUT VIC AB 1 CT1 27 (SUTURE) ×1
SUT VIC AB 1 CT1 27XBRD ANTBC (SUTURE) ×1 IMPLANT
SUT VIC AB 2-0 CT1 27 (SUTURE)
SUT VIC AB 2-0 CT1 TAPERPNT 27 (SUTURE) IMPLANT
TAPE CLOTH SURG 4X10 WHT LF (GAUZE/BANDAGES/DRESSINGS) ×2 IMPLANT
TOWEL OR 17X26 10 PK STRL BLUE (TOWEL DISPOSABLE) ×2 IMPLANT

## 2013-05-21 NOTE — Preoperative (Addendum)
Beta Blockers   Reason not to administer Beta Blockers:Not Applicable, received 25 mg Lopressor at 1005

## 2013-05-21 NOTE — Interval H&P Note (Signed)
History and Physical Interval Note:  05/21/2013 5:07 PM  [name]  has presented today for surgery, with the diagnosis of right intertrochanteric fracture  The various methods of treatment have been discussed with the patient and family. After consideration of risks, benefits and other options for treatment, the patient has consented to  Procedure(s): INTRAMEDULLARY (IM) NAIL FEMORAL (Right) as a surgical intervention .  The patient's history has been reviewed, patient examined, no change in status, stable for surgery.  I have reviewed the patient's chart and labs.  Questions were answered to the patient's satisfaction.     YATES,MARK C

## 2013-05-21 NOTE — Care Management Note (Signed)
  Page 1 of 1   05/21/2013     11:25:48 AM   CARE MANAGEMENT NOTE 05/21/2013  Patient:  [name], [name]   Account Number:  [id number]  Date Initiated:  05/21/2013  Documentation initiated by:  Colleen Can  Subjective/Objective Assessment:   dx rt intertrochanteric fx     Action/Plan:   CM will follow up after surgery is com[pleted and PT?OT eval is done.   Anticipated DC Date:  05/23/2013   Anticipated DC Plan:  SKILLED NURSING FACILITY  In-house referral  Clinical Social Worker      DC Planning Services  CM consult      Choice offered to / List presented to:             Status of service:  In process, will continue to follow Medicare Important Message given?   (If response is "NO", the following Medicare IM given date fields will be blank) Date Medicare IM given:   Date Additional Medicare IM given:    Discharge Disposition:    Per UR Regulation:  Reviewed for med. necessity/level of care/duration of stay  If discussed at Long Length of Stay Meetings, dates discussed:    Comments:

## 2013-05-21 NOTE — Anesthesia Postprocedure Evaluation (Signed)
  Anesthesia Post-op Note  Patient: [name]  Procedure(s) Performed: Procedure(s) (LRB): INTRAMEDULLARY (IM) NAIL FEMORAL (Right)  Patient Location: PACU  Anesthesia Type: Spinal  Level of Consciousness: awake and alert   Airway and Oxygen Therapy: Patient Spontanous Breathing  Post-op Pain: mild  Post-op Assessment: Post-op Vital signs reviewed, Patient's Cardiovascular Status Stable, Respiratory Function Stable, Patent Airway and No signs of Nausea or vomiting  Last Vitals:  Filed Vitals:   05/21/13 1930  BP: 115/53  Pulse: 89  Temp:   Resp: 18    Post-op Vital Signs: stable   Complications: No apparent anesthesia complications. Spinal has resolved more than 3 levels. T10 to L2

## 2013-05-21 NOTE — H&P (View-Only) (Signed)
Reason for Consult:right hp IT hip fracture  Referring Physician:Dr. Valda Christenson is an 79 y.o. female.  HPI: fell going to [facility], uses W/C and also walker for ambulation, SNF residence for about 18 months  Past Medical History  Diagnosis Date  . Cancer   . Hypertension   . Dementia   . COPD (chronic obstructive pulmonary disease)   . Peripheral neuropathy     Past Surgical History  Procedure Laterality Date  . Breast lumpectomy    . Lung cancer surgery    . Uterine cancer      History reviewed. No pertinent family history.  Social History:  reports that she has never smoked. She has never used smokeless tobacco. She reports that  drinks alcohol. She reports that she does not use illicit drugs.  Allergies:  Allergies  Allergen Reactions  . Abilify (Aripiprazole)     Medications: I have reviewed the patient's current medications.  Results for orders placed during the hospital encounter of 05/20/13 (from the past 48 hour(s))  CBC WITH DIFFERENTIAL     Status: None   Collection Time    05/20/13  9:05 PM      Result Value Range   WBC 7.3  4.0 - 10.5 K/uL   RBC 4.53  3.87 - 5.11 MIL/uL   Hemoglobin 14.3  12.0 - 15.0 g/dL   HCT 46.9  62.9 - 52.8 %   MCV 95.4  78.0 - 100.0 fL   MCH 31.6  26.0 - 34.0 pg   MCHC 33.1  30.0 - 36.0 g/dL   RDW 41.3  24.4 - 01.0 %   Platelets 180  150 - 400 K/uL   Neutrophils Relative % 68  43 - 77 %   Neutro Abs 4.9  1.7 - 7.7 K/uL   Lymphocytes Relative 22  12 - 46 %   Lymphs Abs 1.6  0.7 - 4.0 K/uL   Monocytes Relative 10  3 - 12 %   Monocytes Absolute 0.7  0.1 - 1.0 K/uL   Eosinophils Relative 1  0 - 5 %   Eosinophils Absolute 0.1  0.0 - 0.7 K/uL   Basophils Relative 0  0 - 1 %   Basophils Absolute 0.0  0.0 - 0.1 K/uL  COMPREHENSIVE METABOLIC PANEL     Status: Abnormal   Collection Time    05/20/13  9:05 PM      Result Value Range   Sodium 136  135 - 145 mEq/L   Potassium 3.6  3.5 - 5.1 mEq/L   Chloride 94 (*) 96 -  112 mEq/L   CO2 30  19 - 32 mEq/L   Glucose, Bld 105 (*) 70 - 99 mg/dL   BUN 13  6 - 23 mg/dL   Creatinine, Ser 2.72  0.50 - 1.10 mg/dL   Calcium 9.4  8.4 - 53.6 mg/dL   Total Protein 6.4  6.0 - 8.3 g/dL   Albumin 3.4 (*) 3.5 - 5.2 g/dL   AST 28  0 - 37 U/L   ALT 26  0 - 35 U/L   Alkaline Phosphatase 86  39 - 117 U/L   Total Bilirubin 0.3  0.3 - 1.2 mg/dL   GFR calc non Af Amer 77 (*) >90 mL/min   GFR calc Af Amer 89 (*) >90 mL/min   Comment:            The eGFR has been calculated     using the CKD EPI  equation.     This calculation has not been     validated in all clinical     situations.     eGFR's persistently     <90 mL/min signify     possible Chronic Kidney Disease.  PROTIME-INR     Status: None   Collection Time    05/20/13  9:05 PM      Result Value Range   Prothrombin Time 12.2  11.6 - 15.2 seconds   INR 0.92  0.00 - 1.49  APTT     Status: None   Collection Time    05/20/13  9:05 PM      Result Value Range   aPTT 29  24 - 37 seconds  URINALYSIS, ROUTINE W REFLEX MICROSCOPIC     Status: None   Collection Time    05/21/13 12:40 AM      Result Value Range   Color, Urine YELLOW  YELLOW   APPearance CLEAR  CLEAR   Specific Gravity, Urine 1.015  1.005 - 1.030   pH 8.0  5.0 - 8.0   Glucose, UA NEGATIVE  NEGATIVE mg/dL   Hgb urine dipstick NEGATIVE  NEGATIVE   Bilirubin Urine NEGATIVE  NEGATIVE   Ketones, ur NEGATIVE  NEGATIVE mg/dL   Protein, ur NEGATIVE  NEGATIVE mg/dL   Urobilinogen, UA 0.2  0.0 - 1.0 mg/dL   Nitrite NEGATIVE  NEGATIVE   Leukocytes, UA NEGATIVE  NEGATIVE   Comment: MICROSCOPIC NOT DONE ON URINES WITH NEGATIVE PROTEIN, BLOOD, LEUKOCYTES, NITRITE, OR GLUCOSE <1000 mg/dL.  TROPONIN I     Status: None   Collection Time    05/21/13 12:45 AM      Result Value Range   Troponin I <0.30  <0.30 ng/mL   Comment:            Due to the release kinetics of cTnI,     a negative result within the first hours     of the onset of symptoms does not rule  out     myocardial infarction with certainty.     If myocardial infarction is still suspected,     repeat the test at appropriate intervals.  CBC     Status: None   Collection Time    05/21/13 12:45 AM      Result Value Range   WBC 10.1  4.0 - 10.5 K/uL   RBC 4.31  3.87 - 5.11 MIL/uL   Hemoglobin 13.7  12.0 - 15.0 g/dL   HCT 16.1  09.6 - 04.5 %   MCV 94.0  78.0 - 100.0 fL   MCH 31.8  26.0 - 34.0 pg   MCHC 33.8  30.0 - 36.0 g/dL   RDW 40.9  81.1 - 91.4 %   Platelets 166  150 - 400 K/uL  BASIC METABOLIC PANEL     Status: Abnormal   Collection Time    05/21/13 12:45 AM      Result Value Range   Sodium 137  135 - 145 mEq/L   Potassium 3.5  3.5 - 5.1 mEq/L   Chloride 97  96 - 112 mEq/L   CO2 30  19 - 32 mEq/L   Glucose, Bld 120 (*) 70 - 99 mg/dL   BUN 13  6 - 23 mg/dL   Creatinine, Ser 7.82  0.50 - 1.10 mg/dL   Calcium 9.0  8.4 - 95.6 mg/dL   GFR calc non Af Amer 78 (*) >90 mL/min   GFR calc Af  Amer >90  >90 mL/min   Comment:            The eGFR has been calculated     using the CKD EPI equation.     This calculation has not been     validated in all clinical     situations.     eGFR's persistently     <90 mL/min signify     possible Chronic Kidney Disease.  TYPE AND SCREEN     Status: None   Collection Time    05/21/13 12:45 AM      Result Value Range   ABO/RH(D) O POS     Antibody Screen NEG     Sample Expiration 05/24/2013      Dg Hip Complete Right  05/20/2013   *RADIOLOGY REPORT*  Clinical Data: Fall, right hip pain  RIGHT HIP - COMPLETE 2+ VIEW  Comparison: None.  Findings: There is an acute displaced and angulated right hip intertrochanteric fracture.  Bones are osteopenic.  Degenerative changes of the lumbar spine and left hip as well.  Pelvis appears intact.  Postop changes throughout the pelvis bilaterally. Nonobstructive bowel gas pattern.  IMPRESSION: Acute right hip intertrochanteric fracture.   Original Report Authenticated By: Judie Petit. Miles Costain, M.D.   Dg Chest  Port 1 View  05/21/2013   *RADIOLOGY REPORT*  Clinical Data: Preoperative respiratory films for patient with a hip fracture.  COPD.  PORTABLE CHEST - 1 VIEW  Comparison: CT chest 03/18/2013.  Findings: The chest is hyperexpanded consistent with history of emphysema.  Lungs are clear.  No pneumothorax or pleural fluid. Heart size is upper normal.  IMPRESSION: No acute finding.  Emphysema.   Original Report Authenticated By: Holley Dexter, M.D.    Review of Systems  Constitutional: Positive for weight loss. Negative for fever and chills.  Eyes:       Neg for glasses  Respiratory: Positive for shortness of breath and wheezing.   Cardiovascular: Negative.   Genitourinary: Negative.   Musculoskeletal:       Right hip pain  Skin: Negative for rash.  Neurological: Negative for focal weakness and headaches.  Psychiatric/Behavioral: Negative for depression.   Blood pressure 144/89, pulse 100, temperature 98.7 F (37.1 C), temperature source Oral, resp. rate 16, height 5\' 7"  (1.702 m), weight 72.576 kg (160 lb), SpO2 90.00%. Physical Exam  Constitutional: She is oriented to person, place, and time. She appears well-developed and well-nourished.  HENT:  Head: Normocephalic.  Neck: Normal range of motion.  Cardiovascular: Normal rate.   Respiratory: Effort normal.  GI: Soft.  Musculoskeletal:  Right hip short , ER pulses normal  Neurological: She is alert and oriented to person, place, and time.  Skin: Skin is warm.    Assessment/Plan:  right IT hip Fx, for surgery today at 6 PM,  Bucks traction was not applied, ordered now.  Discussed surgery and plan for fixation with patient, risks discussed all ?'s answered she requests we proceed.   my Beeper [phone] and cell [phone] [name] C 05/21/2013, 7:21 AM

## 2013-05-21 NOTE — Anesthesia Preprocedure Evaluation (Addendum)
Anesthesia Evaluation  Patient identified by MRN, date of birth, ID band Patient awake  General Assessment Comment:.  Cancer     .  Hypertension     .  Dementia     .  COPD (chronic obstructive pulmonary disease)     .  Peripheral neuropathy      Dr. Katherene Ponto H and P reviewed. Cleared for surgery.   Reviewed: Allergy & Precautions, H&P , NPO status , Patient's Chart, lab work & pertinent test results  Airway Mallampati: II TM Distance: >3 FB Neck ROM: Full    Dental no notable dental hx.    Pulmonary COPD Dr. Rebecca Eaton office note of 03-26-13 reviewed.  CXR: 05-20-13 No acute disease. Emphysema. breath sounds clear to auscultation  Pulmonary exam normal       Cardiovascular hypertension, Pt. on medications and Pt. on home beta blockers negative cardio ROS  Rhythm:Regular Rate:Normal  ECG: reviewed. 05-20-13: Repolarization abnormality, Anteriorly-laterally.   Neuro/Psych PSYCHIATRIC DISORDERS Anxiety  Neuromuscular disease    GI/Hepatic Neg liver ROS, GERD-  Medicated,  Endo/Other  negative endocrine ROS  Renal/GU negative Renal ROS  negative genitourinary   Musculoskeletal negative musculoskeletal ROS (+)   Abdominal   Peds negative pediatric ROS (+)  Hematology negative hematology ROS (+)   Anesthesia Other Findings   Reproductive/Obstetrics negative OB ROS                         Anesthesia Physical Anesthesia Plan  ASA: III  Anesthesia Plan: Spinal   Post-op Pain Management:    Induction: Intravenous  Airway Management Planned:   Additional Equipment:   Intra-op Plan:   Post-operative Plan:   Informed Consent: I have reviewed the patients History and Physical, chart, labs and discussed the procedure including the risks, benefits and alternatives for the proposed anesthesia with the patient or authorized representative who has indicated his/her understanding and  acceptance.   Dental advisory given  Plan Discussed with: CRNA  Anesthesia Plan Comments: (Discussed general versus spinal.Spinal may be better in setting of COPD and  lung cancer.)     Anesthesia Quick Evaluation

## 2013-05-21 NOTE — Progress Notes (Signed)
Nutrition Brief Note  Patient identified on the Malnutrition Screening Tool (MST) Report  Wt Readings from Last 15 Encounters:  05/20/13 160 lb (72.576 kg)  05/20/13 160 lb (72.576 kg)  03/26/13 159 lb 12.8 oz (72.485 kg)  02/24/13 158 lb (71.668 kg)  01/07/13 161 lb (73.029 kg)  09/18/12 167 lb (75.751 kg)  08/09/08 147 lb 8 oz (66.906 kg)    Body mass index is 25.05 kg/(m^2). Patient meets criteria for overweight based on current BMI.   Current diet order is NPO for right hip surgery today. Labs and medications reviewed. Pt from a SNF. Met with pt who reports eating well PTA, 3 meals/day with stable weight between 155-160 pounds. Denies any problems chewing/swallowing. Encouraged pt to drink 2-3 Ensure/day after surgery to promote wound healing and recovery.   No nutrition interventions warranted at this time. Recommend Ensure Complete BID once diet advanced. If nutrition issues arise, please consult RD.   Levon Hedger MS, RD, LDN [phone] Pager [phone] After Hours Pager

## 2013-05-21 NOTE — Progress Notes (Signed)
Ortho tech attempted to reposition patient to place in Bucks traction, but patient in contracted state and lying on side. She refused to turn for placement of traction. MD may call Q. Orion Crook Tech with questions at [phone]. Pain is controlled with 2 mg Morphine, as long as she stays on her left side. Continuing to medicate PRN and monitoring closely. Lashawna Poche [place], [place] 05/21/2013

## 2013-05-21 NOTE — Op Note (Signed)
Test test  Preop diagnosis right intertrochanteric hip fracture   Postop diagnosis: Same  Procedure: Right trochanteric hip screw. Biomet 380x11 mm with 95 mm lag screw  Surgeon: Annell Greening M.D.  Anesthesia spinal +10 cc Marcaine local  EBL: Less than 100 cc  Complications: None  Operative procedure: After induction of spinal anesthesia patient was placed on the fracture table well legholder right foot was put in a boot traction internal rotation. Patient had a knee flexion contracture and leg was suggested lowering it so that the femur was as close to parallel to the floor as possible reduction was performed leg was taken out to length with traction. Once reduction was achieved AP and lateral fluoroscopy confirming this the hip was prepped with DuraPrep area square totalis timeout procedure taken large shower curtain Betadine Steri-Drape was applied and secured to the pulse.  Incision was made starting proximal trochanter in line with the gluteus medius. Of electrocautery used for small bleeders. Fascia was split finger palpation over the piriformis tip of the trochanter and start her Steinmann pin was drilled confirm under fluoroscopy AP and lateral then overreamed. The tip rod was bent slightly. Finger guide was flipped over the top and it down the canal and the rod was advanced down to the knee. AP and lateral confirmed good reduction. The length was measured and nail was inserted impacted in appropriate position checked under fluoroscopy and then the lag screw inserted. Tip of the rod was [place] there was slight angulation on lateral with the anterior portion of the neck being slightly anterior but the lack screw went directly up the center of the head in good position was measured at 95 mm locked and guide was removed and then final spot pictures were taken one was irrigated. When closure with #1 Vicryl in the deep fascia 2-0 Vicryl subtendinous tissue skin staples postoperative  dressing after Mark infiltration and transferred to the recovery room in stable condition. Patient tolerated the procedure well was spinal. Vernon Prey.D.

## 2013-05-21 NOTE — Brief Op Note (Signed)
05/20/2013 - 05/21/2013  6:26 PM  PATIENT:  [name]  79 y.o. female  PRE-OPERATIVE DIAGNOSIS:  right intertrochanteric fracture  POST-OPERATIVE DIAGNOSIS:  right intertrochanteric fracture  PROCEDURE:  Procedure(s): INTRAMEDULLARY (IM) NAIL FEMORAL (Right) Trochanteric hip screw nail for intertrochanteric fracture. biomet 11 X 380 with 95mm lag screw  SURGEON:  Surgeon(s) and Role:    * Eldred Manges, MD - Primary  PHYSICIAN ASSISTANT: none  ASSISTANTS: none   ANESTHESIA:   local and spinal  EBL:  Total I/O In: 585.7 [I.V.:585.7] Out: 300 [Urine:300]  BLOOD ADMINISTERED:none  DRAINS: none   LOCAL MEDICATIONS USED:  MARCAINE     SPECIMEN:  No Specimen  DISPOSITION OF SPECIMEN:  N/A  COUNTS:  YES  TOURNIQUET:  * No tourniquets in log *  DICTATION: .Other Dictation: Dictation Number [id number]  PLAN OF CARE: still inpatient  PATIENT DISPOSITION:  PACU - hemodynamically stable.   Delay start of Pharmacological VTE agent (>24hrs) due to surgical blood loss or risk of bleeding: yes

## 2013-05-21 NOTE — Progress Notes (Signed)
ANTICOAGULATION CONSULT NOTE - Initial Consult  Pharmacy Consult for Warfarin Indication: VTE prophylaxis  Allergies  Allergen Reactions  . Abilify (Aripiprazole)     Patient Measurements: Height: 5\' 7"  (170.2 cm) Weight: 160 lb (72.576 kg) IBW/kg (Calculated) : 61.6  Vital Signs: Temp: 98.7 F (37.1 C) (08/07 1955) Temp src: Oral (08/07 1500) BP: 103/66 mmHg (08/07 1955) Pulse Rate: 85 (08/07 1955)  Labs:  Recent Labs  05/20/13 2105 05/21/13 0045  HGB 14.3 13.7  HCT 43.2 40.5  PLT 180 166  APTT 29  --   LABPROT 12.2  --   INR 0.92  --   CREATININE 0.76 0.73  TROPONINI  --  <0.30    Estimated Creatinine Clearance: 53.6 ml/min (by C-G formula based on Cr of 0.73).   Medical History: Past Medical History  Diagnosis Date  . Cancer   . Hypertension   . Dementia   . COPD (chronic obstructive pulmonary disease)   . Peripheral neuropathy     Medications:  Scheduled:  . anastrozole  1 mg Oral Daily  . beta carotene w/minerals  1 tablet Oral Daily  . busPIRone  10 mg Oral BID  . cholecalciferol  1,000 Units Oral Daily  . divalproex  250 mg Oral Q24H  . divalproex  375 mg Oral Daily  . docusate sodium  100 mg Oral BID  . donepezil  10 mg Oral QHS  . ferrous sulfate  325 mg Oral BID  . gabapentin  600 mg Oral Daily  . metoprolol tartrate  25 mg Oral Daily  . MUSCLE RUB  1 application Topical Q6H   Infusions:  . sodium chloride 75 mL/hr (05/21/13 2004)    Assessment: 79 yo F admitted 05/20/13 s/p fall with resultant hip fracture. Patient is now s/p IM nailing, to start warfarin for DVT prophylaxis. Baseline PT/INR and CBC wnl. Due to age > 32 y.o., will start with more conservative warfarin dose.  Goal of Therapy:  INR 2-3 Monitor platelets by anticoagulation protocol: Yes   Plan:  1) Warfarin 4mg  PO x1 tonight 2) Warfarin book and video for tomorrow 3) Warfarin education prior to discharge 4) Daily PT/INR  Darrol Angel, PharmD Pager:  [phone] 05/21/2013,8:34 PM

## 2013-05-21 NOTE — Clinical Documentation Improvement (Signed)
THIS DOCUMENT IS NOT A PERMANENT PART OF THE MEDICAL RECORD  Please update your documentation with the medical record to reflect your response to this query. If you need help knowing how to do this please call [phone].  05/21/13  Dear Dr. Elisabeth Pigeon[facility],  In a better effort to capture your patient's severity of illness, reflect appropriate length of stay and utilization of resources, a review of the patient medical record has revealed the following indicators.    Based on your clinical judgment, please clarify and document in a progress note and/or discharge summary the clinical condition associated with the following supporting information:  In responding to this query please exercise your independent judgment.  The fact that a query is asked, does not imply that any particular answer is desired or expected.   Pt with Hip fx  Per Hip Xray: There is an acute displaced and angulated right hip intertrochanteric fracture. Bones are osteopenic.   Clarification Needed   Please clarify if hip fracture in setting of osteopenic bones can be further specified as one of the diagnoses listed below and document in pn or d/c summary.    Possible Clinical Conditions?  _______Pathological Fracture _______Pathological Fracture due to slight trauma that is incongruent with injury _______Osteoporotic Fracture _______Traumatic fracture not pathological in nature _______Insufficiency Fracture _______Stress/Non-Traumatic Fracture _______Spontaneous Fracture _______Compression Fx due to (please specify bone disease) _______Other Condition________________ _______Cannot Clinically Determine   Supporting Information:  Risk Factors:    Hip fracture COPD   Signs & Symptoms:   Osteopenic bones  Diagnostics:  Hip Xray 05/20/13 There is an acute displaced and angulated right hip intertrochanteric fracture. Bones are osteopenic.   Treatments Medications   You may use possible,  probable, or suspect with inpatient documentation. possible, probable, suspected diagnoses MUST be documented at the time of discharge  Reviewed: change made in progress note 8/7  Thank You,  Enis Slipper  RN, BSN, MSN/Inf, CCDS Clinical Documentation Specialist [facility] Dept Pager: [phone] / E-mail: [email or web address]  [phone] Health Information Management [place]

## 2013-05-21 NOTE — Consult Note (Addendum)
Reason for Consult:right hp IT hip fracture  Referring Physician:Dr. Krakrakandy   [name] is an 79 y.o. female.  HPI: fell going to [facility], uses W/C and also walker for ambulation, SNF residence for about 18 months  Past Medical History  Diagnosis Date  . Cancer   . Hypertension   . Dementia   . COPD (chronic obstructive pulmonary disease)   . Peripheral neuropathy     Past Surgical History  Procedure Laterality Date  . Breast lumpectomy    . Lung cancer surgery    . Uterine cancer      History reviewed. No pertinent family history.  Social History:  reports that she has never smoked. She has never used smokeless tobacco. She reports that  drinks alcohol. She reports that she does not use illicit drugs.  Allergies:  Allergies  Allergen Reactions  . Abilify (Aripiprazole)     Medications: I have reviewed the patient's current medications.  Results for orders placed during the hospital encounter of 05/20/13 (from the past 48 hour(s))  CBC WITH DIFFERENTIAL     Status: None   Collection Time    05/20/13  9:05 PM      Result Value Range   WBC 7.3  4.0 - 10.5 K/uL   RBC 4.53  3.87 - 5.11 MIL/uL   Hemoglobin 14.3  12.0 - 15.0 g/dL   HCT 43.2  36.0 - 46.0 %   MCV 95.4  78.0 - 100.0 fL   MCH 31.6  26.0 - 34.0 pg   MCHC 33.1  30.0 - 36.0 g/dL   RDW 12.8  11.5 - 15.5 %   Platelets 180  150 - 400 K/uL   Neutrophils Relative % 68  43 - 77 %   Neutro Abs 4.9  1.7 - 7.7 K/uL   Lymphocytes Relative 22  12 - 46 %   Lymphs Abs 1.6  0.7 - 4.0 K/uL   Monocytes Relative 10  3 - 12 %   Monocytes Absolute 0.7  0.1 - 1.0 K/uL   Eosinophils Relative 1  0 - 5 %   Eosinophils Absolute 0.1  0.0 - 0.7 K/uL   Basophils Relative 0  0 - 1 %   Basophils Absolute 0.0  0.0 - 0.1 K/uL  COMPREHENSIVE METABOLIC PANEL     Status: Abnormal   Collection Time    05/20/13  9:05 PM      Result Value Range   Sodium 136  135 - 145 mEq/L   Potassium 3.6  3.5 - 5.1 mEq/L   Chloride 94 (*) 96 -  112 mEq/L   CO2 30  19 - 32 mEq/L   Glucose, Bld 105 (*) 70 - 99 mg/dL   BUN 13  6 - 23 mg/dL   Creatinine, Ser 0.76  0.50 - 1.10 mg/dL   Calcium 9.4  8.4 - 10.5 mg/dL   Total Protein 6.4  6.0 - 8.3 g/dL   Albumin 3.4 (*) 3.5 - 5.2 g/dL   AST 28  0 - 37 U/L   ALT 26  0 - 35 U/L   Alkaline Phosphatase 86  39 - 117 U/L   Total Bilirubin 0.3  0.3 - 1.2 mg/dL   GFR calc non Af Amer 77 (*) >90 mL/min   GFR calc Af Amer 89 (*) >90 mL/min   Comment:            The eGFR has been calculated     using the CKD EPI   equation.     This calculation has not been     validated in all clinical     situations.     eGFR's persistently     <90 mL/min signify     possible Chronic Kidney Disease.  PROTIME-INR     Status: None   Collection Time    05/20/13  9:05 PM      Result Value Range   Prothrombin Time 12.2  11.6 - 15.2 seconds   INR 0.92  0.00 - 1.49  APTT     Status: None   Collection Time    05/20/13  9:05 PM      Result Value Range   aPTT 29  24 - 37 seconds  URINALYSIS, ROUTINE W REFLEX MICROSCOPIC     Status: None   Collection Time    05/21/13 12:40 AM      Result Value Range   Color, Urine YELLOW  YELLOW   APPearance CLEAR  CLEAR   Specific Gravity, Urine 1.015  1.005 - 1.030   pH 8.0  5.0 - 8.0   Glucose, UA NEGATIVE  NEGATIVE mg/dL   Hgb urine dipstick NEGATIVE  NEGATIVE   Bilirubin Urine NEGATIVE  NEGATIVE   Ketones, ur NEGATIVE  NEGATIVE mg/dL   Protein, ur NEGATIVE  NEGATIVE mg/dL   Urobilinogen, UA 0.2  0.0 - 1.0 mg/dL   Nitrite NEGATIVE  NEGATIVE   Leukocytes, UA NEGATIVE  NEGATIVE   Comment: MICROSCOPIC NOT DONE ON URINES WITH NEGATIVE PROTEIN, BLOOD, LEUKOCYTES, NITRITE, OR GLUCOSE <1000 mg/dL.  TROPONIN I     Status: None   Collection Time    05/21/13 12:45 AM      Result Value Range   Troponin I <0.30  <0.30 ng/mL   Comment:            Due to the release kinetics of cTnI,     a negative result within the first hours     of the onset of symptoms does not rule  out     myocardial infarction with certainty.     If myocardial infarction is still suspected,     repeat the test at appropriate intervals.  CBC     Status: None   Collection Time    05/21/13 12:45 AM      Result Value Range   WBC 10.1  4.0 - 10.5 K/uL   RBC 4.31  3.87 - 5.11 MIL/uL   Hemoglobin 13.7  12.0 - 15.0 g/dL   HCT 40.5  36.0 - 46.0 %   MCV 94.0  78.0 - 100.0 fL   MCH 31.8  26.0 - 34.0 pg   MCHC 33.8  30.0 - 36.0 g/dL   RDW 12.7  11.5 - 15.5 %   Platelets 166  150 - 400 K/uL  BASIC METABOLIC PANEL     Status: Abnormal   Collection Time    05/21/13 12:45 AM      Result Value Range   Sodium 137  135 - 145 mEq/L   Potassium 3.5  3.5 - 5.1 mEq/L   Chloride 97  96 - 112 mEq/L   CO2 30  19 - 32 mEq/L   Glucose, Bld 120 (*) 70 - 99 mg/dL   BUN 13  6 - 23 mg/dL   Creatinine, Ser 0.73  0.50 - 1.10 mg/dL   Calcium 9.0  8.4 - 10.5 mg/dL   GFR calc non Af Amer 78 (*) >90 mL/min   GFR calc Af   Amer >90  >90 mL/min   Comment:            The eGFR has been calculated     using the CKD EPI equation.     This calculation has not been     validated in all clinical     situations.     eGFR's persistently     <90 mL/min signify     possible Chronic Kidney Disease.  TYPE AND SCREEN     Status: None   Collection Time    05/21/13 12:45 AM      Result Value Range   ABO/RH(D) O POS     Antibody Screen NEG     Sample Expiration 05/24/2013      Dg Hip Complete Right  05/20/2013   *RADIOLOGY REPORT*  Clinical Data: Fall, right hip pain  RIGHT HIP - COMPLETE 2+ VIEW  Comparison: None.  Findings: There is an acute displaced and angulated right hip intertrochanteric fracture.  Bones are osteopenic.  Degenerative changes of the lumbar spine and left hip as well.  Pelvis appears intact.  Postop changes throughout the pelvis bilaterally. Nonobstructive bowel gas pattern.  IMPRESSION: Acute right hip intertrochanteric fracture.   Original Report Authenticated By: M. Shick, M.D.   Dg Chest  Port 1 View  05/21/2013   *RADIOLOGY REPORT*  Clinical Data: Preoperative respiratory films for patient with a hip fracture.  COPD.  PORTABLE CHEST - 1 VIEW  Comparison: CT chest 03/18/2013.  Findings: The chest is hyperexpanded consistent with history of emphysema.  Lungs are clear.  No pneumothorax or pleural fluid. Heart size is upper normal.  IMPRESSION: No acute finding.  Emphysema.   Original Report Authenticated By: Thomas D'Alessio, M.D.    Review of Systems  Constitutional: Positive for weight loss. Negative for fever and chills.  Eyes:       Neg for glasses  Respiratory: Positive for shortness of breath and wheezing.   Cardiovascular: Negative.   Genitourinary: Negative.   Musculoskeletal:       Right hip pain  Skin: Negative for rash.  Neurological: Negative for focal weakness and headaches.  Psychiatric/Behavioral: Negative for depression.   Blood pressure 144/89, pulse 100, temperature 98.7 F (37.1 C), temperature source Oral, resp. rate 16, height 5' 7" (1.702 m), weight 72.576 kg (160 lb), SpO2 90.00%. Physical Exam  Constitutional: She is oriented to person, place, and time. She appears well-developed and well-nourished.  HENT:  Head: Normocephalic.  Neck: Normal range of motion.  Cardiovascular: Normal rate.   Respiratory: Effort normal.  GI: Soft.  Musculoskeletal:  Right hip short , ER pulses normal  Neurological: She is alert and oriented to person, place, and time.  Skin: Skin is warm.    Assessment/Plan:  right IT hip Fx, for surgery today at 6 PM,  Bucks traction was not applied, ordered now.  Discussed surgery and plan for fixation with patient, risks discussed all ?'s answered she requests we proceed.   my Beeper [phone] and cell [phone] [place] Defina C 05/21/2013, 7:21 AM      

## 2013-05-21 NOTE — Progress Notes (Addendum)
[facility] PROGRESS NOTE  [name] AVW:[id number] DOB: December 11, 1929 DOA: 05/20/2013 PCP: Bufford Spikes, DO  Brief narrative: 79 -year-old female with past medical history of lung cancer, breast cancer (under Dr. Asa Lente care), no evidence of disease progression on recent studies, COPD who presented to [facility] ED 05/20/2013 status post fall in the nursing home while trying to get up from the commode. Patient was found to have acute right intertrochanteric hip fracture.  Assessment/Plan:  Principal Problem: Acute right intertrochanteric hip fracture, traumatic, non-pathologic - Appreciate orthopedic surgery consult and recommendations - Increase morphine to 2 mg every 2 hours IV as needed for better pain control; continue Norco 2 tablets every 6 hours as needed for moderate pain - Physical therapy evaluation once patient is stable Active Problems:   ANXIETY - Continue BuSpar and   HYPERTENSION - Continue metoprolol 25 mg daily   Dementia - Continue Aricept  Code Status: full code Family Communication: Family not at the bedside Disposition Plan: Home when stable  Manson Passey, MD  [facility] Pager [phone]  If 7PM-7AM, please contact night-coverage [email or web address] Password [email or web address] 05/21/2013, 7:25 AM   LOS: 1 day   Consultants:  Orthopedic surgery  Procedures:  None but plan for ortho surgery today  Antibiotics:  None   HPI/Subjective: Pain in the right lower extremity.  Objective: Filed Vitals:   05/20/13 2054 05/20/13 2315 05/20/13 2330 05/21/13 0516  BP: 123/84 144/79  144/89  Pulse: 78 80  100  Temp: 97.7 F (36.5 C) 98.4 F (36.9 C)  98.7 F (37.1 C)  TempSrc: Oral Oral  Oral  Resp: 16 16  16   Height:   5\' 7"  (1.702 m)   Weight:   72.576 kg (160 lb)   SpO2: 100% 99%  90%    Intake/Output Summary (Last 24 hours) at 05/21/13 0725 Last data filed at 05/21/13 0600  Gross per 24 hour  Intake 553.75 ml  Output    201 ml  Net 352.75 ml     Exam:   General:  Pt is sleeping, not in acute distress  Cardiovascular: Regular rate and rhythm, S1/S2 appreciated  Respiratory: Clear to auscultation bilaterally, no wheezing, no crackles, no rhonchi  Abdomen: Soft, non tender, non distended, bowel sounds present, no guarding  Extremities: Pulses DP and PT palpable bilaterally; pain in right leg  Neuro: Grossly nonfocal  Data Reviewed: Basic Metabolic Panel:  Recent Labs Lab 05/20/13 2105 05/21/13 0045  NA 136 137  K 3.6 3.5  CL 94* 97  CO2 30 30  GLUCOSE 105* 120*  BUN 13 13  CREATININE 0.76 0.73  CALCIUM 9.4 9.0   Liver Function Tests:  Recent Labs Lab 05/20/13 2105  AST 28  ALT 26  ALKPHOS 86  BILITOT 0.3  PROT 6.4  ALBUMIN 3.4*   No results found for this basename: LIPASE, AMYLASE,  in the last 168 hours No results found for this basename: AMMONIA,  in the last 168 hours CBC:  Recent Labs Lab 05/20/13 2105 05/21/13 0045  WBC 7.3 10.1  NEUTROABS 4.9  --   HGB 14.3 13.7  HCT 43.2 40.5  MCV 95.4 94.0  PLT 180 166   Cardiac Enzymes:  Recent Labs Lab 05/21/13 0045  TROPONINI <0.30   BNP: No components found with this basename: POCBNP,  CBG: No results found for this basename: GLUCAP,  in the last 168 hours  No results found for this or any previous visit (from the past 240 hour(s)).   Studies: Dg  Hip Complete Right  05/20/2013   *RADIOLOGY REPORT*  Clinical Data: Fall, right hip pain  RIGHT HIP - COMPLETE 2+ VIEW  Comparison: None.  Findings: There is an acute displaced and angulated right hip intertrochanteric fracture.  Bones are osteopenic.  Degenerative changes of the lumbar spine and left hip as well.  Pelvis appears intact.  Postop changes throughout the pelvis bilaterally. Nonobstructive bowel gas pattern.  IMPRESSION: Acute right hip intertrochanteric fracture.   Original Report Authenticated By: Judie Petit. Miles Costain, M.D.   Dg Chest Port 1 View  05/21/2013   *RADIOLOGY REPORT*  Clinical  Data: Preoperative respiratory films for patient with a hip fracture.  COPD.  PORTABLE CHEST - 1 VIEW  Comparison: CT chest 03/18/2013.  Findings: The chest is hyperexpanded consistent with history of emphysema.  Lungs are clear.  No pneumothorax or pleural fluid. Heart size is upper normal.  IMPRESSION: No acute finding.  Emphysema.   Original Report Authenticated By: Holley Dexter, M.D.    Scheduled Meds: . anastrozole  1 mg Oral Daily  . busPIRone  10 mg Oral BID  . divalproex  250 mg Oral Q24H  . divalproex  375 mg Oral Daily  . donepezil  10 mg Oral QHS  . ferrous sulfate  325 mg Oral BID  . gabapentin  600 mg Oral Daily  . metoprolol tartrate  25 mg Oral Daily

## 2013-05-21 NOTE — Anesthesia Procedure Notes (Signed)
Spinal  Patient location during procedure: OR Staffing Anesthesiologist: Azell Der Performed by: anesthesiologist  Preanesthetic Checklist Completed: patient identified, site marked, surgical consent, pre-op evaluation, timeout performed, IV checked, risks and benefits discussed and monitors and equipment checked Spinal Block Patient position: sitting Prep: Betadine Patient monitoring: heart rate, continuous pulse ox and blood pressure Approach: right paramedian Location: L3-4 Injection technique: single-shot Needle Needle type: Spinocan  Needle gauge: 22 G Needle length: 9 cm Additional Notes Expiration date of kit checked and confirmed. Patient tolerated procedure well, without complications. CSF clear and no paresthesia.

## 2013-05-21 NOTE — Progress Notes (Signed)
Clinical Social Work Department BRIEF PSYCHOSOCIAL ASSESSMENT 05/21/2013  Patient:  THERESIA, PREE     Account Number:  [id number]     Admit date:  05/20/2013  Clinical Social Worker:  Candie Chroman  Date/Time:  05/21/2013 02:16 PM  Referred by:  Physician  Date Referred:  05/21/2013 Referred for  SNF Placement   Other Referral:   Interview type:  Patient Other interview type:    PSYCHOSOCIAL DATA Living Status:  FACILITY Admitted from facility:  [facility] Level of care:  Skilled Nursing Facility Primary support name:  Adron Bene Primary support relationship to patient:  SIBLING Degree of support available:   Supportive [place] of state.    CURRENT CONCERNS Current Concerns  Post-Acute Placement   Other Concerns:    SOCIAL WORK ASSESSMENT / PLAN Pt is an 79 yr old female admitted from [facility] . Pt fell at [facility] and fx her hip 8/6. Surgery is scheduled for today. CSW met with pt and spoke with brother via phone. Pt plans to return to SNF when stable for d/c. SNF contacted and is able to readmit following hospital d/c. CSW will continue to follow and assist with d/c planning needs.   Assessment/plan status:  Psychosocial Support/Ongoing Assessment of Needs Other assessment/ plan:   Information/referral to community resources:   None needed at this time.    PATIENT'S/FAMILY'S RESPONSE TO PLAN OF CARE: Pt / brother are in agreement with plan to return to [facility] at d/c.   Cori Razor LCSW [phone]

## 2013-05-21 NOTE — Transfer of Care (Signed)
Immediate Anesthesia Transfer of Care Note  Patient: [name]  Procedure(s) Performed: Procedure(s): INTRAMEDULLARY (IM) NAIL FEMORAL (Right)  Patient Location: PACU  Anesthesia Type:Spinal  Level of Consciousness: awake, alert , oriented and patient cooperative  Airway & Oxygen Therapy: Patient Spontanous Breathing and Patient connected to nasal cannula oxygen  Post-op Assessment: Report given to PACU RN and Post -op Vital signs reviewed and stable  Post vital signs: Reviewed and stable  Complications: No apparent anesthesia complications

## 2013-05-22 ENCOUNTER — Encounter (HOSPITAL_COMMUNITY): Payer: Self-pay | Admitting: Orthopaedic Surgery

## 2013-05-22 DIAGNOSIS — K219 Gastro-esophageal reflux disease without esophagitis: Secondary | ICD-10-CM

## 2013-05-22 LAB — BASIC METABOLIC PANEL
CO2: 27 mEq/L (ref 19–32)
Calcium: 8.7 mg/dL (ref 8.4–10.5)
Chloride: 104 mEq/L (ref 96–112)
Creatinine, Ser: 0.57 mg/dL (ref 0.50–1.10)
GFR calc Af Amer: >90 mL/min (ref >90)
Sodium: 142 mEq/L (ref 135–145)

## 2013-05-22 LAB — CBC
MCV: 95.7 fL (ref 78.0–100.0)
Platelets: 138 10*3/uL — ABNORMAL LOW (ref 150–400)
RBC: 3.97 MIL/uL (ref 3.87–5.11)
RDW: 12.9 % (ref 11.5–15.5)
WBC: 11 10*3/uL — ABNORMAL HIGH (ref 4.0–10.5)

## 2013-05-22 LAB — URINE CULTURE

## 2013-05-22 LAB — PROTIME-INR: INR: 1.1 (ref 0.00–1.49)

## 2013-05-22 MED ORDER — MORPHINE SULFATE 2 MG/ML IJ SOLN
1.0000 mg | INTRAMUSCULAR | Status: DC | PRN
  Administered 2013-05-22: 1 mg via INTRAVENOUS
  Administered 2013-05-22 (×3): 2 mg via INTRAVENOUS
  Filled 2013-05-22 (×4): qty 1

## 2013-05-22 MED ORDER — WARFARIN SODIUM 3 MG PO TABS
3.0000 mg | ORAL_TABLET | Freq: Once | ORAL | Status: DC
  Filled 2013-05-22: qty 1

## 2013-05-22 MED ORDER — LORAZEPAM 0.5 MG PO TABS
0.5000 mg | ORAL_TABLET | Freq: Once | ORAL | Status: AC
  Administered 2013-05-22: 0.5 mg via ORAL
  Filled 2013-05-22: qty 1

## 2013-05-22 MED ORDER — HYDROCODONE-ACETAMINOPHEN 5-325 MG PO TABS: 2.0000 | ORAL_TABLET | Freq: Four times a day (QID) | ORAL | Status: DC | PRN

## 2013-05-22 MED ORDER — WARFARIN SODIUM 2 MG PO TABS: 2.0000 mg | ORAL_TABLET | Freq: Every day | ORAL | Status: DC

## 2013-05-22 MED ORDER — ASPIRIN EC 325 MG PO TBEC
325.0000 mg | DELAYED_RELEASE_TABLET | Freq: Two times a day (BID) | ORAL | Status: DC
  Administered 2013-05-22 – 2013-05-23 (×2): 325 mg via ORAL
  Filled 2013-05-22 (×3): qty 1

## 2013-05-22 NOTE — Progress Notes (Addendum)
ANTICOAGULATION CONSULT NOTE - Follow Up Consult  Pharmacy Consult for Warfarin Indication: VTE prophylaxis  Allergies  Allergen Reactions  . Abilify (Aripiprazole)     Patient Measurements: Height: 5\' 7"  (170.2 cm) Weight: 160 lb (72.576 kg) IBW/kg (Calculated) : 61.6  Vital Signs: Temp: 98 F (36.7 C) (08/08 0600) Temp src: Oral (08/08 0600) BP: 137/89 mmHg (08/08 0600) Pulse Rate: 125 (08/08 0600)  Labs:  Recent Labs  05/20/13 2105 05/21/13 0045 05/22/13 0755  HGB 14.3 13.7 12.3  HCT 43.2 40.5 38.0  PLT 180 166 138*  APTT 29  --   --   LABPROT 12.2  --  14.0  INR 0.92  --  1.10  CREATININE 0.76 0.73  --   TROPONINI  --  <0.30  --     Estimated Creatinine Clearance: 53.6 ml/min (by C-G formula based on Cr of 0.73).   Medications:  Scheduled:  . anastrozole  1 mg Oral Daily  . beta carotene w/minerals  1 tablet Oral Daily  . busPIRone  10 mg Oral BID  . cholecalciferol  1,000 Units Oral Daily  . coumadin book   Does not apply Once  . divalproex  250 mg Oral Q24H  . divalproex  375 mg Oral Daily  . docusate sodium  100 mg Oral BID  . donepezil  10 mg Oral QHS  . ferrous sulfate  325 mg Oral BID  . gabapentin  600 mg Oral Daily  . metoprolol tartrate  25 mg Oral Daily  . MUSCLE RUB  1 application Topical Q6H  . warfarin   Does not apply Once  . Warfarin - Pharmacist Dosing Inpatient   Does not apply q1800   Infusions:  . sodium chloride 75 mL/hr (05/21/13 2004)    Assessment: 79 yo F admitted 05/20/13 s/p fall with hip fracture, s/p repair 8/7. Started on warfarin for postop VTE prophylaxis.  INR subtherapeutic as expected after first dose  H/H stable, Pltc low at baseline, no bleeding/complications reported.  Goal of Therapy:  INR 2-3   Plan:   Warfarin 3mg  po x 1 today  Daily PT/INR  Warfarin education prior to discharge  Loralee Pacas, PharmD, BCPS Pager: [phone] 05/22/2013,8:45 AM   Addendum: Per discussion with RN, Dr. Ophelia Charter  agreed to change VTE prophylaxis to aspirin 325mg  BID instead of warfarin (age, falls).  Loralee Pacas, PharmD, BCPS 05/22/2013 11:25 AM

## 2013-05-22 NOTE — Progress Notes (Signed)
Rescheduled labs for 0730. Patient is asleep for the first time since surgery.

## 2013-05-22 NOTE — Progress Notes (Signed)
Subjective: 1 Day Post-Op Procedure(s) (LRB): INTRAMEDULLARY (IM) NAIL FEMORAL (Right) Patient reports pain as moderate.    Objective: Vital signs in last 24 hours: Temp:  [97.3 F (36.3 C)-98.8 F (37.1 C)] 98 F (36.7 C) (08/08 0600) Pulse Rate:  [74-125] 125 (08/08 0600) Resp:  [10-20] 18 (08/08 0600) BP: (95-137)/(51-89) 137/89 mmHg (08/08 0600) SpO2:  [92 %-98 %] 95 % (08/08 0600)  Intake/Output from previous day: 08/07 0701 - 08/08 0700 In: 2150.7 [P.O.:120; I.V.:2030.7] Out: 1455 [Urine:1355; Blood:100] Intake/Output this shift: Total I/O In: 120 [P.O.:120] Out: 200 [Urine:200]   Recent Labs  05/20/13 2105 05/21/13 0045 05/22/13 0755  HGB 14.3 13.7 12.3    Recent Labs  05/21/13 0045 05/22/13 0755  WBC 10.1 11.0*  RBC 4.31 3.97  HCT 40.5 38.0  PLT 166 138*    Recent Labs  05/21/13 0045 05/22/13 0755  NA 137 142  K 3.5 3.5  CL 97 104  CO2 30 27  BUN 13 11  CREATININE 0.73 0.57  GLUCOSE 120* 168*  CALCIUM 9.0 8.7    Recent Labs  05/20/13 2105 05/22/13 0755  INR 0.92 1.10    Neurologically intact  Dressing after being pulled up bloody. hgb stable.    Assessment/Plan: 1 Day Post-Op Procedure(s) (LRB): INTRAMEDULLARY (IM) NAIL FEMORAL (Right) Up with therapy Discharge to SNF patient wants to go [facility] SN[facility]  ? [address]  [employer] C 05/22/2013, 10:30 AM

## 2013-05-22 NOTE — Progress Notes (Signed)
CSW assisting with d/c planning. [facility] is able to readmit pt when stable for d/c. Pt is looking forward to returning to SNF.  Cori Razor LCSW [phone]

## 2013-05-22 NOTE — Progress Notes (Signed)
05/22/13 1700  PT Visit Information  Last PT Received On 05/22/13  Assistance Needed +2  History of Present Illness right intertrochanteric hip fracture; s/p IM nail  PT Time Calculation  PT Start Time 1445  PT Stop Time 1503  PT Time Calculation (min) 18 min  Subjective Data  Patient Stated Goal back to SNF  Precautions  Precautions Fall  Restrictions  RLE Weight Bearing WBAT  Cognition  Arousal/Alertness Awake/alert  Behavior During Therapy WFL for tasks assessed/performed  Overall Cognitive Status Within Functional Limits for tasks assessed  Bed Mobility  Bed Mobility Sit to Supine  Sit to Supine 1: +2 Total assist  Sit to Supine: Patient Percentage 50%  Details for Bed Mobility Assistance assist for trunk and LEs;   Transfers  Transfers Sit to Stand;Stand to Sit;Stand Pivot Transfers  Sit to Stand 1: +2 Total assist  Sit to Stand: Patient Percentage 40%  Stand to Sit 1: +2 Total assist  Stand to Sit: Patient Percentage 30%  Stand Pivot Transfers 1: +2 Total assist  Stand Pivot Transfers: Patient Percentage 40%  Details for Transfer Assistance cues for hand placement, wt shift; assist to rise and control descent  PT - End of Session  Equipment Utilized During Treatment Gait belt  Activity Tolerance Patient limited by pain  Patient left in bed;with call bell/phone within reach  Nurse Communication Mobility status  PT - Assessment/Plan  PT Plan Current plan remains appropriate  PT Frequency Min 3X/week  Follow Up Recommendations SNF  PT equipment None recommended by PT  PT Goal Progression  Progress towards PT goals Progressing toward goals  PT General Charges  $$ ACUTE PT VISIT 1 Procedure  PT Treatments  $Therapeutic Activity 8-22 mins

## 2013-05-22 NOTE — Evaluation (Signed)
Physical Therapy Evaluation Patient Details Name: [name] MRN: [id number] DOB: 1930/02/05 Today's Date: 05/22/2013 Time: 9562-1308 PT Time Calculation (min): 17 min  PT Assessment / Plan / Recommendation History of Present Illness  right intertrochanteric hip fracture; s/p IM nail  Clinical Impression  Pt will benefit from PT  To address deficits below    PT Assessment  Patient needs continued PT services    Follow Up Recommendations  SNF    Does the patient have the potential to tolerate intense rehabilitation      Barriers to Discharge        Equipment Recommendations  None recommended by PT    Recommendations for Other Services     Frequency Min 3X/week    Precautions / Restrictions Precautions Precautions: Fall Restrictions RLE Weight Bearing: Weight bearing as tolerated   Pertinent Vitals/Pain C/o right hip pain not rated     Mobility  Bed Mobility Bed Mobility: Supine to Sit Supine to Sit: 1: +2 Total assist Supine to Sit: Patient Percentage: 40% Details for Bed Mobility Assistance: assist for trunk and LEs;  Transfers Transfers: Sit to Stand;Stand to Sit;Stand Pivot Transfers Sit to Stand: 1: +2 Total assist Sit to Stand: Patient Percentage: 40% Stand to Sit: 1: +2 Total assist Stand to Sit: Patient Percentage: 30% Stand Pivot Transfers: 1: +2 Total assist Stand Pivot Transfers: Patient Percentage: 30% Details for Transfer Assistance: cues for hand placement, wt shift; assist to rise and control descent    Exercises     PT Diagnosis: Difficulty walking;Generalized weakness  PT Problem List: Decreased strength;Decreased range of motion;Decreased activity tolerance;Decreased balance;Decreased mobility;Decreased safety awareness;Decreased knowledge of use of DME PT Treatment Interventions: DME instruction;Gait training;Functional mobility training;Therapeutic activities;Patient/family education;Therapeutic exercise;Balance training     PT  Goals(Current goals can be found in the care plan section) Acute Rehab PT Goals Patient Stated Goal: back to SNF PT Goal Formulation: With patient Time For Goal Achievement: 06/05/13 Potential to Achieve Goals: Good  Visit Information  Last PT Received On: 05/22/13 History of Present Illness: right intertrochanteric hip fracture; s/p IM nail       Prior Functioning  Home Living Family/patient expects to be discharged to:: Skilled nursing facility Additional Comments: pt from [facility]  Communication Communication: No difficulties    Cognition  Cognition Arousal/Alertness: Awake/alert Behavior During Therapy: WFL for tasks assessed/performed Overall Cognitive Status: Within Functional Limits for tasks assessed (dementia at baseline)    Extremity/Trunk Assessment Upper Extremity Assessment Upper Extremity Assessment: Generalized weakness Lower Extremity Assessment Lower Extremity Assessment: Generalized weakness RLE: Unable to fully assess due to pain   Balance Static Standing Balance Static Standing - Balance Support: Bilateral upper extremity supported Static Standing - Level of Assistance: 1: +2 Total assist  End of Session PT - End of Session Equipment Utilized During Treatment: Gait belt Activity Tolerance: Patient limited by pain Patient left: in chair;with call bell/phone within reach Nurse Communication: Mobility status  GP     [facility] 05/22/2013, 1:42 PM

## 2013-05-22 NOTE — Progress Notes (Signed)
[facility] PROGRESS NOTE  [name]:[id number] DOB: March 24, 1930 DOA: 05/20/2013 PCP: Bufford Spikes, DO  Brief narrative: 79 -year-old female with past medical history of lung cancer, breast cancer (under Dr. Asa Lente care), no evidence of disease progression on recent studies, COPD who presented to [facility] ED 05/20/2013 status post fall in the nursing home while trying to get up from the commode. Patient was found to have acute right intertrochanteric hip fracture.   Assessment/Plan:   Principal Problem:  Acute right intertrochanteric hip fracture, traumatic, non-pathologic  - Appreciate orthopedic surgery consult and recommendations; status post intramedullary nail femoral done 05/21/2013 - Still has significant amount of pain and requires IV narcotics; Continue morphine 2 mg every 2 hours IV as needed for pain control; continue Norco 2 tablets every 6 hours as needed for moderate pain  - Physical therapy evaluation - will return to SNF when stable  Active Problems:  ANXIETY  - Continue BuSpar  HYPERTENSION  - Continue metoprolol 25 mg daily  Dementia  - Continue Aricept  H/O breast cancer - continue anastrozole   Code Status: full code  Family Communication: Family not at the bedside  Disposition Plan: to SNF when stable, when pain is better controlled  Manson Passey, MD  [facility]  Pager [phone]   Consultants:  Orthopedic surgery Procedures:  Intramedullary nail femoral Antibiotics:  None    If 7PM-7AM, please contact night-coverage [email or web address] Password [facility] 05/22/2013, 9:52 AM   LOS: 2 days    HPI/Subjective: Still in significant amount of pain today.  Objective: Filed Vitals:   05/21/13 2155 05/21/13 2255 05/22/13 0200 05/22/13 0600  BP: 112/77 123/79 111/68 137/89  Pulse: 81 88 120 125  Temp: 97.3 F (36.3 C) 97.7 F (36.5 C) 98.8 F (37.1 C) 98 F (36.7 C)  TempSrc: Oral Oral Oral Oral  Resp: 18 20 18 18   Height:      Weight:      SpO2: 97% 98%  92% 95%    Intake/Output Summary (Last 24 hours) at 05/22/13 0952 Last data filed at 05/22/13 0644  Gross per 24 hour  Intake 2150.67 ml  Output   1455 ml  Net 695.67 ml    Exam:   General:  Pt is sleepy, not in acute distress  Cardiovascular: Regular rhythm, tachycardic, S1/S2 appreciated  Respiratory: Clear to auscultation bilaterally, no wheezing, no crackles, no rhonchi  Abdomen: Soft, non tender, non distended, bowel sounds present, no guarding  Extremities: No edema, pulses DP and PT palpable bilaterally; pain in right LE  Neuro: Grossly nonfocal  Data Reviewed: Basic Metabolic Panel:  Recent Labs Lab 05/20/13 2105 05/21/13 0045 05/22/13 0755  NA 136 137 142  K 3.6 3.5 3.5  CL 94* 97 104  CO2 30 30 27   GLUCOSE 105* 120* 168*  BUN 13 13 11   CREATININE 0.76 0.73 0.57  CALCIUM 9.4 9.0 8.7   Liver Function Tests:  Recent Labs Lab 05/20/13 2105  AST 28  ALT 26  ALKPHOS 86  BILITOT 0.3  PROT 6.4  ALBUMIN 3.4*   No results found for this basename: LIPASE, AMYLASE,  in the last 168 hours No results found for this basename: AMMONIA,  in the last 168 hours CBC:  Recent Labs Lab 05/20/13 2105 05/21/13 0045 05/22/13 0755  WBC 7.3 10.1 11.0*  NEUTROABS 4.9  --   --   HGB 14.3 13.7 12.3  HCT 43.2 40.5 38.0  MCV 95.4 94.0 95.7  PLT 180 166 138*  Cardiac Enzymes:  Recent Labs Lab 05/21/13 0045  TROPONINI <0.30   BNP: No components found with this basename: POCBNP,  CBG: No results found for this basename: GLUCAP,  in the last 168 hours  URINE CULTURE     Status: None   Collection Time    05/21/13 12:40 AM      Result Value Range Status   Specimen Description URINE, CATHETERIZED   Final   Special Requests NONE   Final   Culture  Setup Time     Final   Value: 05/21/2013 04:57     Performed at [facility]   Colony Count     Final   Value: NO GROWTH     Performed at [facility]   Culture     Final   Value: NO GROWTH      Performed at [facility]   Report Status 05/22/2013 FINAL   Final  SURGICAL PCR SCREEN     Status: Abnormal   Collection Time    05/21/13  4:26 PM      Result Value Range Status   MRSA, PCR NEGATIVE  NEGATIVE Final   Staphylococcus aureus POSITIVE (*) NEGATIVE Final     Studies: Dg Hip Complete Right 05/20/2013   *RADIOLOGY REPORT*  Clinical Data: Fall, right hip pain  RIGHT HIP - COMPLETE 2+ VIEW  Comparison: None.  Findings: There is an acute displaced and angulated right hip intertrochanteric fracture.  Bones are osteopenic.  Degenerative changes of the lumbar spine and left hip as well.  Pelvis appears intact.  Postop changes throughout the pelvis bilaterally. Nonobstructive bowel gas pattern.  IMPRESSION: Acute right hip intertrochanteric fracture.   Original Report Authenticated By: Judie Petit. Shick, M.D.   Dg Femur Right 05/21/2013   *RADIOLOGY REPORT*  Clinical Data: ORIF right hip fracture  DG C-ARM 1-60 MIN - NRPT [facility],RIGHT FEMUR - 2 VIEW  Technique: Spot fluoroscopic intraoperative views  Comparison:  05/20/2013  Findings: Right femoral neck fixation screw and IM femur rod insertion has been performed.  Improved alignment following open reduction and fixation of the right intertrochanteric fracture.  No complicating feature.  IMPRESSION: Improved alignment following ORIF of the right hip intertrochanteric fracture   Original Report Authenticated By: Judie Petit. Miles Costain, M.D.   Dg Pelvis Portable 05/21/2013   *RADIOLOGY REPORT*  Clinical Data: Postop ORIF right hip.  PORTABLE PELVIS  Comparison: 05/20/2013  Findings: Changes of internal fixation across the right femoral intertrochanteric fracture.  Near anatomic alignment.  No visible hardware complicating feature.  Moderate to advanced degenerative changes in the hips bilaterally and lower lumbar spine.  IMPRESSION: Internal fixation across the right intertrochanteric fracture.  No complicating feature.   Original Report Authenticated By: Charlett Nose, M.D.   Dg Chest Port 1 View 05/21/2013   *RADIOLOGY REPORT*  Clinical Data: Preoperative respiratory films for patient with a hip fracture.  COPD.  PORTABLE CHEST - 1 VIEW  Comparison: CT chest 03/18/2013.  Findings: The chest is hyperexpanded consistent with history of emphysema.  Lungs are clear.  No pneumothorax or pleural fluid. Heart size is upper normal.  IMPRESSION: No acute finding.  Emphysema.   Original Report Authenticated By: Holley Dexter, M.D.   Dg C-arm 1-60 Min-no Report 05/21/2013   *RADIOLOGY REPORT*  Clinical Data: ORIF right hip fracture  DG C-ARM 1-60 MIN - NRPT [facility],RIGHT FEMUR - 2 VIEW  Technique: Spot fluoroscopic intraoperative views  Comparison:  05/20/2013  Findings: Right femoral neck fixation screw and  IM femur rod insertion has been performed.  Improved alignment following open reduction and fixation of the right intertrochanteric fracture.  No complicating feature.  IMPRESSION: Improved alignment following ORIF of the right hip intertrochanteric fracture   Original Report Authenticated By: Judie Petit. Shick, M.D.    Scheduled Meds: . anastrozole  1 mg Oral Daily  . busPIRone  10 mg Oral BID  . divalproex  250 mg Oral Q24H  . divalproex  375 mg Oral Daily  . docusate sodium  100 mg Oral BID  . donepezil  10 mg Oral QHS  . ferrous sulfate  325 mg Oral BID  . gabapentin  600 mg Oral Daily  . metoprolol tartrate  25 mg Oral Daily  . warfarin  3 mg Oral ONCE-1800   Continuous Infusions: . sodium chloride 75 mL/hr (05/21/13 2004)

## 2013-05-22 NOTE — Progress Notes (Signed)
05/22/2013 Colleen Can BSN RN CCM [phone] Plans are for SNF rehab; referral to CSW; CM signing off.

## 2013-05-22 NOTE — Progress Notes (Signed)
OT Cancellation Note  Patient Details Name: [name] MRN: [id number] DOB: 03-03-1930   Cancelled Treatment:    Reason Eval/Treat Not Completed: Other (comment) (defer OT eval to SNF)  Lennox Laity [phone] 05/22/2013, 11:50 AM

## 2013-05-23 DIAGNOSIS — Z853 Personal history of malignant neoplasm of breast: Secondary | ICD-10-CM

## 2013-05-23 LAB — BASIC METABOLIC PANEL
BUN: 10 mg/dL (ref 6–23)
CO2: 28 mEq/L (ref 19–32)
Chloride: 105 mEq/L (ref 96–112)
GFR calc non Af Amer: 88 mL/min — ABNORMAL LOW (ref >90)
Glucose, Bld: 127 mg/dL — ABNORMAL HIGH (ref 70–99)
Potassium: 3.6 mEq/L (ref 3.5–5.1)

## 2013-05-23 LAB — CBC
HCT: 30 % — ABNORMAL LOW (ref 36.0–46.0)
Hemoglobin: 9.8 g/dL — ABNORMAL LOW (ref 12.0–15.0)
MCHC: 32.7 g/dL (ref 30.0–36.0)

## 2013-05-23 MED ORDER — METOCLOPRAMIDE HCL 5 MG PO TABS: 5.0000 mg | ORAL_TABLET | Freq: Three times a day (TID) | ORAL | Status: DC | PRN

## 2013-05-23 MED ORDER — WARFARIN SODIUM 2 MG PO TABS: 2.0000 mg | ORAL_TABLET | Freq: Every day | ORAL | Status: DC

## 2013-05-23 MED ORDER — POLYETHYLENE GLYCOL 3350 17 G PO PACK: 17.0000 g | PACK | Freq: Every day | ORAL | Status: AC | PRN

## 2013-05-23 MED ORDER — ONDANSETRON HCL 4 MG PO TABS: 4.0000 mg | ORAL_TABLET | Freq: Four times a day (QID) | ORAL | Status: AC | PRN

## 2013-05-23 MED ORDER — HYDROCODONE-ACETAMINOPHEN 5-325 MG PO TABS: 2.0000 | ORAL_TABLET | Freq: Four times a day (QID) | ORAL | Status: AC | PRN

## 2013-05-23 MED ORDER — BUSPIRONE HCL 10 MG PO TABS: 10.0000 mg | ORAL_TABLET | Freq: Two times a day (BID) | ORAL | Status: AC

## 2013-05-23 MED ORDER — DSS 100 MG PO CAPS: 100.0000 mg | ORAL_CAPSULE | Freq: Two times a day (BID) | ORAL | Status: AC

## 2013-05-23 NOTE — Progress Notes (Signed)
Report given to [facility] at [facility]. Pt to transfer to SNF today.

## 2013-05-23 NOTE — Progress Notes (Signed)
Physical Therapy Treatment Patient Details Name: [name] MRN: [id number] DOB: [birth year] Today's Date: 05/23/2013 Time: 4098-1191 PT Time Calculation (min): 11 min  PT Assessment / Plan / Recommendation  History of Present Illness right intertrochanteric hip fracture; s/p IM nail   PT Comments   Pt limited by pain; Pt concerned about her pants being lost in the ED, NT checking on status  Follow Up Recommendations  SNF     Does the patient have the potential to tolerate intense rehabilitation     Barriers to Discharge        Equipment Recommendations  None recommended by PT    Recommendations for Other Services    Frequency Min 3X/week   Progress towards PT Goals Progress towards PT goals: Progressing toward goals  Plan Current plan remains appropriate    Precautions / Restrictions Precautions Precautions: Fall Restrictions RLE Weight Bearing: Weight bearing as tolerated   Pertinent Vitals/Pain C/o pain, NT to make RN aware   Mobility  Bed Mobility Bed Mobility: Rolling Right;Rolling Left Rolling Right: 3: Mod assist;2: Max assist Rolling Left: 3: Mod assist;2: Max assist Details for Bed Mobility Assistance: repeated rolling x 3 to assist nurse tech with donning pants     Exercises Total Joint Exercises Ankle Circles/Pumps: AROM;Both;10 reps;Supine Quad Sets: AROM;Strengthening;5 reps;Supine Short Arc Quad: AAROM;Both;10 reps;Supine   PT Diagnosis:    PT Problem List:   PT Treatment Interventions:     PT Goals (current goals can now be found in the care plan section) Acute Rehab PT Goals Patient Stated Goal: back to SNF  Visit Information  Last PT Received On: 05/23/13 Assistance Needed: +2 History of Present Illness: right intertrochanteric hip fracture; s/p IM nail    Subjective Data  Subjective: It hurts Patient Stated Goal: back to SNF   Cognition  Cognition Arousal/Alertness: Awake/alert Behavior During Therapy: Restless;Anxious Overall  Cognitive Status: History of cognitive impairments - at baseline (dementia ; pt follows commands)    Balance     End of Session PT - End of Session Activity Tolerance: Patient limited by pain Patient left: in bed;with call bell/phone within reach   GP     [facility] 05/23/2013, 12:48 PM

## 2013-05-23 NOTE — Progress Notes (Signed)
Subjective: 2 Days Post-Op Procedure(s) (LRB): INTRAMEDULLARY (IM) NAIL FEMORAL (Right) Patient reports pain as moderate.    Objective: Vital signs in last 24 hours: Temp:  [98.9 F (37.2 C)-99.1 F (37.3 C)] 99.1 F (37.3 C) (08/09 0620) Pulse Rate:  [97-119] 97 (08/09 0620) Resp:  [16-18] 18 (08/09 0800) BP: (111-123)/(68-79) 112/69 mmHg (08/09 0620) SpO2:  [91 %-93 %] 93 % (08/09 0620)  Intake/Output from previous day: 08/08 0701 - 08/09 0700 In: 1913.7 [P.O.:360; I.V.:1553.7] Out: 550 [Urine:550] Intake/Output this shift: Total I/O In: -  Out: 1 [Urine:1]   Recent Labs  05/20/13 2105 05/21/13 0045 05/22/13 0755 05/23/13 0431  HGB 14.3 13.7 12.3 9.8*    Recent Labs  05/22/13 0755 05/23/13 0431  WBC 11.0* 10.2  RBC 3.97 3.15*  HCT 38.0 30.0*  PLT 138* 135*    Recent Labs  05/22/13 0755 05/23/13 0431  NA 142 140  K 3.5 3.6  CL 104 105  CO2 27 28  BUN 11 10  CREATININE 0.57 0.51  GLUCOSE 168* 127*  CALCIUM 8.7 8.7    Recent Labs  05/20/13 2105 05/22/13 0755  INR 0.92 1.10    Neurologically intact DRESSING DRY Assessment/Plan: 2 Days Post-Op Procedure(s) (LRB): INTRAMEDULLARY (IM) NAIL FEMORAL (Right) Up with therapy  BACK TO SNF.      Tiasha Helvie C 05/23/2013, 10:11 AM

## 2013-05-23 NOTE — Progress Notes (Signed)
Assessment unchanged. Pt's pain allayed with po analgesia. [facility] paramedics x 2 present to transfer pt back to facility. Discharge packet given to paramedics as prepared by Child psychotherapist. Discharged via stretcher to meet awaiting ambulance to carry to [facility] facility. Herbert Seta, RN called with updated report and made aware pt on the way.

## 2013-05-23 NOTE — Discharge Summary (Signed)
Physician Discharge Summary  BRANDELYN HENNE WGN:[id number] DOB: 26-Jan-1930 DOA: 05/20/2013  PCP: Bufford Spikes, DO  Admit date: 05/20/2013 Discharge date: 05/23/2013  Recommendations for Outpatient Follow-up:  1. Please continue coumadin per ortho recommendations 3 mg at bedtime, keep INR 2-3; and continue coumadin for 4 weeks from discharge date  2. Please recheck CBC in next 3-4 days; hemoglobin at the time of discharge is 9.8. This is likely post operatively and she did not require blood transfusion while in hospital   3. Electrolytes and kidney function has been within normal limits.  Discharge Diagnoses:  Principal Problem:   Hip fracture Active Problems:   ANXIETY   HYPERTENSION   GERD (gastroesophageal reflux disease)   Dementia    Discharge Condition: medically stable for discharge to SNF today  Diet recommendation: as tolerated  History of present illness:  79 -year-old female with past medical history of lung cancer, breast cancer (under Dr. Asa Lente care), no evidence of disease progression on recent studies, COPD who presented to [facility] ED 05/20/2013 status post fall in the nursing home while trying to get up from the commode. Patient was found to have acute right intertrochanteric hip fracture.   Assessment/Plan:   Principal Problem:  Acute right intertrochanteric hip fracture, traumatic, non-pathologic  - Appreciate orthopedic surgery consult and recommendations; status post intramedullary nail femoral done 05/21/2013  - Norco for better pain control as needed, prescription provided - Physical therapy evaluation - return to SNF today Active Problems:  ANXIETY  - Continue BuSpar  HYPERTENSION  - Continue metoprolol 25 mg daily  Dementia  - Continue Aricept  H/O breast cancer  - continue anastrozole   Code Status: full code  Family Communication: Family not at the bedside   Manson Passey, MD  [facility]  Pager [phone]   Consultants:  Orthopedic surgery Procedures:   Intramedullary nail femoral Antibiotics:  None    Discharge Exam: Filed Vitals:   05/23/13 0800  BP:   Pulse:   Temp:   Resp: 18   Filed Vitals:   05/22/13 1405 05/22/13 2220 05/23/13 0620 05/23/13 0800  BP: 123/79 111/68 112/69   Pulse: 119 114 97   Temp: 99.1 F (37.3 C) 98.9 F (37.2 C) 99.1 F (37.3 C)   TempSrc: Oral Oral Oral   Resp: 18 18 18 18   Height:      Weight:      SpO2: 93% 91% 93%     General: Pt is alert,  not in acute distress Cardiovascular: Regular rate and rhythm, S1/S2 +, no murmurs, no rubs, no gallops Respiratory: Clear to auscultation bilaterally, no wheezing, no crackles, no rhonchi Abdominal: Soft, non tender, non distended, bowel sounds +, no guarding Extremities: no edema, no cyanosis, pulses palpable bilaterally DP and PT Neuro: Grossly nonfocal  Discharge Instructions  Discharge Orders   Future Appointments Provider Department Dept Phone   09/23/2013 11:00 AM Dava Najjar [facility] MED[facility]Y [phone]   09/23/2013 12:00 PM [facility] [phone]   Patient to arrive 15 minutes prior to appointment time. Patient to pick up oral contrast at least 1 day prior to exam, unless otherwise instructed by your physician. No solid food 4 hours prior to exam. Liquids and Medicines are okay.   09/24/2013 11:00 AM Si Gaul, MD [facility] MEDICAL ONCOLOGY [phone]   Future Orders Complete By Expires     Call MD for:  difficulty breathing, headache or visual disturbances  As directed     Call MD for:  persistant dizziness or light-headedness  As directed     Call MD for:  persistant nausea and vomiting  As directed     Call MD for:  severe uncontrolled pain  As directed     Diet - low sodium heart healthy  As directed     Discharge instructions  As directed     Comments:      1. Please continue coumadin per ortho recommendations 3 mg at bedtime, keep INR  2-3; and continue coumadin for 4 weeks from discharge date 2. Please recheck CBC in next 3-4 days; hemoglobin at the time of discharge is 9.8. This is likely post operatively and she did not require blood transfusion while in hospital  3. Electrolytes and kidney function has been within normal limits.    Increase activity slowly  As directed         Medication List    STOP taking these medications       oxyCODONE-acetaminophen 5-325 MG per tablet  Commonly known as:  PERCOCET/ROXICET      TAKE these medications       anastrozole 1 MG tablet  Commonly known as:  ARIMIDEX  Take 1 mg by mouth daily.     beta carotene w/minerals tablet  Take 1 tablet by mouth daily.     BIOFREEZE ROLL-ON 4 % Gel  Generic drug:  Menthol (Topical Analgesic)  Apply 1 application topically every 6 (six) hours. Ever 6 hrs     busPIRone 10 MG tablet  Commonly known as:  BUSPAR  Take 1 tablet (10 mg total) by mouth 2 (two) times daily.     cholecalciferol 1000 UNITS tablet  Commonly known as:  VITAMIN D  Take 1,000 Units by mouth daily.     divalproex 125 MG capsule  Commonly known as:  DEPAKOTE SPRINKLE  Take 250-375 mg by mouth 2 (two) times daily. Take 3 capsules every morning and 2 capsules every evening.     donepezil 10 MG tablet  Commonly known as:  ARICEPT  Take 10 mg by mouth at bedtime.     DSS 100 MG Caps  Take 100 mg by mouth 2 (two) times daily.     ferrous sulfate 325 (65 FE) MG tablet  Take 325 mg by mouth 2 (two) times daily.     furosemide 40 MG tablet  Commonly known as:  LASIX  Take 40 mg by mouth 2 (two) times daily.     gabapentin 600 MG tablet  Commonly known as:  NEURONTIN  Take 600 mg by mouth daily.     HYDROcodone-acetaminophen 5-325 MG per tablet  Commonly known as:  NORCO  Take 2 tablets by mouth every 6 (six) hours as needed for pain.     hydrOXYzine 25 MG tablet  Commonly known as:  ATARAX/VISTARIL  Take 25 mg by mouth 3 (three) times daily as needed.  Itching.     metoCLOPramide 5 MG tablet  Commonly known as:  REGLAN  Take 1-2 tablets (5-10 mg total) by mouth every 8 (eight) hours as needed (if ondansetron (ZOFRAN) ineffective.).     metoprolol tartrate 25 MG tablet  Commonly known as:  LOPRESSOR  Take 25 mg by mouth daily.     ondansetron 4 MG tablet  Commonly known as:  ZOFRAN  Take 1 tablet (4 mg total) by mouth every 6 (six) hours as needed for nausea (for refractory nausea or vomiting.).  polyethylene glycol packet  Commonly known as:  MIRALAX / GLYCOLAX  Take 17 g by mouth daily as needed.     potassium chloride SA 20 MEQ tablet  Commonly known as:  K-DUR,KLOR-CON  Take 20 mEq by mouth daily.     warfarin 2 MG tablet  Commonly known as:  COUMADIN  Take 1 tablet (2 mg total) by mouth daily.           Follow-up Information   Follow up with YATES,MARK C, MD In 2 weeks.   Contact information:   [address] [place] [address] [phone]        The results of significant diagnostics from this hospitalization (including imaging, microbiology, ancillary and laboratory) are listed below for reference.    Significant Diagnostic Studies: Dg Hip Complete Right  05/20/2013   *RADIOLOGY REPORT*  Clinical Data: Fall, right hip pain  RIGHT HIP - COMPLETE 2+ VIEW  Comparison: None.  Findings: There is an acute displaced and angulated right hip intertrochanteric fracture.  Bones are osteopenic.  Degenerative changes of the lumbar spine and left hip as well.  Pelvis appears intact.  Postop changes throughout the pelvis bilaterally. Nonobstructive bowel gas pattern.  IMPRESSION: Acute right hip intertrochanteric fracture.   Original Report Authenticated By: Judie Petit. Shick, M.D.   Dg Femur Right  05/21/2013   *RADIOLOGY REPORT*  Clinical Data: ORIF right hip fracture  DG C-ARM 1-60 MIN - NRPT [facility],RIGHT FEMUR - 2 VIEW  Technique: Spot fluoroscopic intraoperative views  Comparison:  05/20/2013  Findings: Right femoral neck  fixation screw and IM femur rod insertion has been performed.  Improved alignment following open reduction and fixation of the right intertrochanteric fracture.  No complicating feature.  IMPRESSION: Improved alignment following ORIF of the right hip intertrochanteric fracture   Original Report Authenticated By: Judie Petit. Miles Costain, M.D.   Dg Pelvis Portable  05/21/2013   *RADIOLOGY REPORT*  Clinical Data: Postop ORIF right hip.  PORTABLE PELVIS  Comparison: 05/20/2013  Findings: Changes of internal fixation across the right femoral intertrochanteric fracture.  Near anatomic alignment.  No visible hardware complicating feature.  Moderate to advanced degenerative changes in the hips bilaterally and lower lumbar spine.  IMPRESSION: Internal fixation across the right intertrochanteric fracture.  No complicating feature.   Original Report Authenticated By: Charlett Nose, M.D.   Dg Chest Port 1 View  05/21/2013   *RADIOLOGY REPORT*  Clinical Data: Preoperative respiratory films for patient with a hip fracture.  COPD.  PORTABLE CHEST - 1 VIEW  Comparison: CT chest 03/18/2013.  Findings: The chest is hyperexpanded consistent with history of emphysema.  Lungs are clear.  No pneumothorax or pleural fluid. Heart size is upper normal.  IMPRESSION: No acute finding.  Emphysema.   Original Report Authenticated By: Holley Dexter, M.D.   Dg C-arm 1-60 Min-no Report  05/21/2013   *RADIOLOGY REPORT*  Clinical Data: ORIF right hip fracture  DG C-ARM 1-60 MIN - NRPT [facility],RIGHT FEMUR - 2 VIEW  Technique: Spot fluoroscopic intraoperative views  Comparison:  05/20/2013  Findings: Right femoral neck fixation screw and IM femur rod insertion has been performed.  Improved alignment following open reduction and fixation of the right intertrochanteric fracture.  No complicating feature.  IMPRESSION: Improved alignment following ORIF of the right hip intertrochanteric fracture   Original Report Authenticated By: Judie Petit. Miles Costain, M.D.     Microbiology: Recent Results (from the past 240 hour(s))  URINE CULTURE     Status: None   Collection Time  05/21/13 12:40 AM      Result Value Range Status   Specimen Description URINE, CATHETERIZED   Final   Special Requests NONE   Final   Culture  Setup Time     Final   Value: 05/21/2013 04:57     Performed at [facility]   Colony Count     Final   Value: NO GROWTH     Performed at [facility]   Culture     Final   Value: NO GROWTH     Performed at [facility]   Report Status 05/22/2013 FINAL   Final  SURGICAL PCR SCREEN     Status: Abnormal   Collection Time    05/21/13  4:26 PM      Result Value Range Status   MRSA, PCR NEGATIVE  NEGATIVE Final   Staphylococcus aureus POSITIVE (*) NEGATIVE Final     Labs: Basic Metabolic Panel:  Recent Labs Lab 05/20/13 2105 05/21/13 0045 05/22/13 0755 05/23/13 0431  NA 136 137 142 140  K 3.6 3.5 3.5 3.6  CL 94* 97 104 105  CO2 30 30 27 28   GLUCOSE 105* 120* 168* 127*  BUN 13 13 11 10   CREATININE 0.76 0.73 0.57 0.51  CALCIUM 9.4 9.0 8.7 8.7   Liver Function Tests:  Recent Labs Lab 05/20/13 2105  AST 28  ALT 26  ALKPHOS 86  BILITOT 0.3  PROT 6.4  ALBUMIN 3.4*   No results found for this basename: LIPASE, AMYLASE,  in the last 168 hours No results found for this basename: AMMONIA,  in the last 168 hours CBC:  Recent Labs Lab 05/20/13 2105 05/21/13 0045 05/22/13 0755 05/23/13 0431  WBC 7.3 10.1 11.0* 10.2  NEUTROABS 4.9  --   --   --   HGB 14.3 13.7 12.3 9.8*  HCT 43.2 40.5 38.0 30.0*  MCV 95.4 94.0 95.7 95.2  PLT 180 166 138* 135*   Cardiac Enzymes:  Recent Labs Lab 05/21/13 0045  TROPONINI <0.30   BNP: BNP (last 3 results) No results found for this basename: PROBNP,  in the last 8760 hours CBG: No results found for this basename: GLUCAP,  in the last 168 hours  Time coordinating discharge: Over 30 minutes  Signed:  Manson Passey, MD  [facility]  05/23/2013, 11:53  AM  Pager #: [phone]

## 2013-05-23 NOTE — Progress Notes (Signed)
Per MD, Pt ready for d/c.  Pt made aware of d/c by RN.  CSW contacted Pt's brother and notified him of d/c.  Pt's brother felt that Pt was being d/c'd too soon.  CSW assured Mr. Pollie Meyer that MD feels that Pt is medically ready.  Sent d/c summary.  Confirmed receipt of d/c summary.  Facility ready to receive Pt.  Arranged for transportation.  Providence Crosby, LCSWA Clinical Social Work [phone]

## 2013-05-25 ENCOUNTER — Other Ambulatory Visit: Payer: Self-pay | Admitting: Geriatric Medicine

## 2013-05-25 ENCOUNTER — Encounter: Payer: Self-pay | Admitting: Internal Medicine

## 2013-05-25 ENCOUNTER — Non-Acute Institutional Stay (SKILLED_NURSING_FACILITY): Payer: Medicare Other | Admitting: Internal Medicine

## 2013-05-25 DIAGNOSIS — I10 Essential (primary) hypertension: Secondary | ICD-10-CM

## 2013-05-25 DIAGNOSIS — J4489 Other specified chronic obstructive pulmonary disease: Secondary | ICD-10-CM

## 2013-05-25 DIAGNOSIS — G609 Hereditary and idiopathic neuropathy, unspecified: Secondary | ICD-10-CM

## 2013-05-25 DIAGNOSIS — S72009D Fracture of unspecified part of neck of unspecified femur, subsequent encounter for closed fracture with routine healing: Secondary | ICD-10-CM

## 2013-05-25 DIAGNOSIS — Z853 Personal history of malignant neoplasm of breast: Secondary | ICD-10-CM

## 2013-05-25 DIAGNOSIS — C3490 Malignant neoplasm of unspecified part of unspecified bronchus or lung: Secondary | ICD-10-CM

## 2013-05-25 DIAGNOSIS — K219 Gastro-esophageal reflux disease without esophagitis: Secondary | ICD-10-CM

## 2013-05-25 DIAGNOSIS — F0390 Unspecified dementia without behavioral disturbance: Secondary | ICD-10-CM

## 2013-05-25 DIAGNOSIS — S72001D Fracture of unspecified part of neck of right femur, subsequent encounter for closed fracture with routine healing: Secondary | ICD-10-CM

## 2013-05-25 DIAGNOSIS — J449 Chronic obstructive pulmonary disease, unspecified: Secondary | ICD-10-CM

## 2013-05-25 DIAGNOSIS — F411 Generalized anxiety disorder: Secondary | ICD-10-CM

## 2013-05-25 DIAGNOSIS — G629 Polyneuropathy, unspecified: Secondary | ICD-10-CM | POA: Insufficient documentation

## 2013-05-25 LAB — VITAMIN D 1,25 DIHYDROXY: Vitamin D3 1, 25 (OH)2: 32 pg/mL

## 2013-05-25 MED ORDER — TRAMADOL HCL 50 MG PO TABS: 50.0000 mg | ORAL_TABLET | ORAL | Status: AC | PRN

## 2013-05-25 NOTE — Assessment & Plan Note (Addendum)
Pt return to SNF after fracture and repair;pt is getting OT,PT and pain medications to continue as needed; pt is on coumadin 2 ng daily and INR needs 2-3 and to continue 4 weeks from d/c on 05/23/2013; CBC and INR drawn 8/11-results pending

## 2013-05-25 NOTE — Assessment & Plan Note (Signed)
Stable- on O2 as needed

## 2013-05-25 NOTE — Assessment & Plan Note (Signed)
continus buspar

## 2013-05-25 NOTE — Assessment & Plan Note (Signed)
Pt is on protonix

## 2013-05-25 NOTE — Assessment & Plan Note (Signed)
Continue aricept 

## 2013-05-25 NOTE — Assessment & Plan Note (Addendum)
Continue neurontin 

## 2013-05-25 NOTE — Assessment & Plan Note (Signed)
Continue anastrozole. 

## 2013-05-25 NOTE — Assessment & Plan Note (Signed)
Pt is on O2 as needed

## 2013-05-25 NOTE — Assessment & Plan Note (Signed)
Continue metoprolol. 

## 2013-05-25 NOTE — Progress Notes (Signed)
MRN: [id number] Name: [name]  Sex: female Age: 79 y.o. DOB: June 27, 1930  PSC #: Ronni Rumble Facility/Room: 107B Level Of Care: SNF Provider: Merrilee Seashore D Emergency Contacts: Extended Emergency Contact Information Primary Emergency Contact: McIntyre,Richard Address: [address] [place], [place] [address] [place] Mobile Phone: [phone] Relation: Brother  Code Status: DNR  Allergies: Abilify  Chief Complaint  Patient presents with  . nursing home admission    HPI: Patient is 79 y.o. female who is readmitted after a fall and hip fx with repair.  Past Medical History  Diagnosis Date  . Cancer   . Dementia   . COPD (chronic obstructive pulmonary disease)   . Hypertension   . Peripheral neuropathy     Past Surgical History  Procedure Laterality Date  . Breast lumpectomy    . Lung cancer surgery    . Uterine cancer    . Femur im nail Right 05/21/2013    Procedure: INTRAMEDULLARY (IM) NAIL FEMORAL;  Surgeon: Eldred Manges, MD;  Location: [facility];  Service: Orthopedics;  Laterality: Right;      Medication List       This list is accurate as of: 05/25/13 12:19 PM.  Always use your most recent med list.               anastrozole 1 MG tablet  Commonly known as:  ARIMIDEX  Take 1 mg by mouth daily.     beta carotene w/minerals tablet  Take 1 tablet by mouth daily.     BIOFREEZE ROLL-ON 4 % Gel  Generic drug:  Menthol (Topical Analgesic)  Apply 1 application topically every 6 (six) hours. Ever 6 hrs     busPIRone 10 MG tablet  Commonly known as:  BUSPAR  Take 1 tablet (10 mg total) by mouth 2 (two) times daily.     dextromethorphan-guaiFENesin 10-100 MG/5ML liquid  Commonly known as:  ROBITUSSIN-DM  Take 5 mLs by mouth every 4 (four) hours as needed for cough.     divalproex 125 MG capsule  Commonly known as:  DEPAKOTE SPRINKLE  Take 250-375 mg by mouth 2 (two) times daily. Take 3 capsules every morning and 2 capsules every evening.     donepezil 10 MG tablet  Commonly known as:  ARICEPT  Take 10 mg by mouth at bedtime.     DSS 100 MG Caps  Take 100 mg by mouth 2 (two) times daily.     ferrous sulfate 325 (65 FE) MG tablet  Take 325 mg by mouth 2 (two) times daily.     furosemide 40 MG tablet  Commonly known as:  LASIX  Take 40 mg by mouth 2 (two) times daily.     gabapentin 600 MG tablet  Commonly known as:  NEURONTIN  Take 600 mg by mouth daily.     HYDROcodone-acetaminophen 5-325 MG per tablet  Commonly known as:  NORCO  Take 2 tablets by mouth every 6 (six) hours as needed for pain.     hydrOXYzine 25 MG tablet  Commonly known as:  ATARAX/VISTARIL  Take 25 mg by mouth 3 (three) times daily as needed. Itching.     metoprolol tartrate 25 MG tablet  Commonly known as:  LOPRESSOR  Take 25 mg by mouth daily.     omeprazole 20 MG capsule  Commonly known as:  PRILOSEC  Take 20 mg by mouth daily.     ondansetron 4 MG tablet  Commonly known as:  ZOFRAN  Take 1 tablet (4 mg total) by mouth every 6 (six) hours as needed for nausea (for refractory nausea or vomiting.).     polyethylene glycol packet  Commonly known as:  MIRALAX / GLYCOLAX  Take 17 g by mouth daily as needed.     potassium chloride SA 20 MEQ tablet  Commonly known as:  K-DUR,KLOR-CON  Take 20 mEq by mouth daily.     QUEtiapine 25 MG tablet  Commonly known as:  SEROQUEL  Take 25 mg by mouth at bedtime.     traMADol 50 MG tablet  Commonly known as:  ULTRAM  Take 50 mg by mouth every 4 (four) hours as needed for pain.     warfarin 2 MG tablet  Commonly known as:  COUMADIN  Take 1 tablet (2 mg total) by mouth daily.        Meds ordered this encounter  Medications  . dextromethorphan-guaiFENesin (ROBITUSSIN-DM) 10-100 MG/5ML liquid    Sig: Take 5 mLs by mouth every 4 (four) hours as needed for cough.  . QUEtiapine (SEROQUEL) 25 MG tablet    Sig: Take 25 mg by mouth at bedtime.  Marland Kitchen omeprazole (PRILOSEC) 20 MG capsule    Sig:  Take 20 mg by mouth daily.  . traMADol (ULTRAM) 50 MG tablet    Sig: Take 50 mg by mouth every 4 (four) hours as needed for pain.     There is no immunization history on file for this patient.  History  Substance Use Topics  . Smoking status: Never Smoker   . Smokeless tobacco: Never Used  . Alcohol Use: Yes     Comment: GLASS RED WINE    Family history is noncontributory    Review of Systems  DATA OBTAINED: from patient, nurse GENERAL: Feels well no fevers, fatigue, appetite changes SKIN: No itching, rash EYES: No eye pain, redness, discharge EARS: No earache, tinnitus, change in hearing NOSE: No congestion, drainage or bleeding  MOUTH/THROAT: No mouth or tooth pain, No sore throat, No difficulty chewing or swallowing  RESPIRATORY:no wheezing, SOB CARDIAC: No chest pain, palpitations, lower extremity edema  GI: No abdominal pain, No N/V/D or constipation, No heartburn or reflux  GU: No dysuria, frequency or urgency MUSCULOSKELETAL: No unrelieved bone/joint pain NEUROLOGIC: Awake, alert, appropriate to situation, No change in mental status. Moves all four, no focal deficits PSYCHIATRIC: No overt anxiety or sadness. Sleeps well. No behavior issue.   Filed Vitals:   05/25/13 1133  BP: 104/68  Pulse: 85  Temp: 97.7 F (36.5 C)  Resp: 20    Physical Exam  GENERAL APPEARANCE: Alert, conversant. Appropriately groomed. No acute distress.  SKIN: No diaphoresis rash, HEAD: Normocephalic, atraumatic  EYES: Conjunctiva/lids clear. Pupils round, reactive. EOMs intact.  EARS: External exam WNL,  Hearing grossly normal.  NOSE: No deformity or discharge.  MOUTH/THROAT: Lips w/o lesions. Mouth and throat normal. RESPIRATORY: Breathing is even, unlabored. Lung sounds are clear   CARDIOVASCULAR: Heart RRR no murmurs, rubs or gallops. No peripheral edema.  ARTERIAL: radial pulse 2+, DP pulse 1+  VENOUS: No varicosities. No venous stasis skin changes  GASTROINTESTINAL: Abdomen is  soft, non-tender, not distended w/ normal bowel sounds.  GENITOURINARY: Bladder non tender, not distended  MUSCULOSKELETAL:surg R hip NEUROLOGIC:  Cranial nerves 2-12 grossly intact. Moves all extremities no tremor. PSYCHIATRIC: Mood and affect appropriate to situation, no behavioral issues  Patient Active Problem List   Diagnosis Date Noted  . COPD (chronic obstructive  pulmonary disease)   . Hypertension   . Peripheral neuropathy   . Dementia 05/21/2013  . Hip fracture 05/20/2013  . GERD (gastroesophageal reflux disease) 01/07/2013  . ADENOCARCINOMA, LUNG 08/05/2008  . ANXIETY 08/05/2008  . HYPERTENSION 08/05/2008  . ADENOCARCINOMA, BREAST, BILATERAL, HX OF 08/05/2008    Functional assessment:   CBC    Component Value Date/Time   WBC 10.2 05/23/2013 0431   WBC 5.4 03/18/2013 1147   RBC 3.15* 05/23/2013 0431   RBC 4.30 03/18/2013 1147   HGB 9.8* 05/23/2013 0431   HGB 13.8 03/18/2013 1147   HCT 30.0* 05/23/2013 0431   HCT 40.6 03/18/2013 1147   PLT 135* 05/23/2013 0431   PLT 164 03/18/2013 1147   MCV 95.2 05/23/2013 0431   MCV 94.5 03/18/2013 1147   LYMPHSABS 1.6 05/20/2013 2105   LYMPHSABS 1.7 03/18/2013 1147   MONOABS 0.7 05/20/2013 2105   MONOABS 0.6 03/18/2013 1147   EOSABS 0.1 05/20/2013 2105   EOSABS 0.1 03/18/2013 1147   BASOSABS 0.0 05/20/2013 2105   BASOSABS 0.0 03/18/2013 1147    CMP     Component Value Date/Time   NA 140 05/23/2013 0431   NA 145 03/18/2013 1134   NA 142 03/09/2011 1239   K 3.6 05/23/2013 0431   K 3.5 03/18/2013 1134   K 3.7 03/09/2011 1239   CL 105 05/23/2013 0431   CL 104 03/18/2013 1134   CL 100 03/09/2011 1239   CO2 28 05/23/2013 0431   CO2 31* 03/18/2013 1134   CO2 27 03/09/2011 1239   GLUCOSE 127* 05/23/2013 0431   GLUCOSE 76 03/18/2013 1134   GLUCOSE 144* 03/09/2011 1239   BUN 10 05/23/2013 0431   BUN 16.9 03/18/2013 1134   BUN 11 03/09/2011 1239   CREATININE 0.51 05/23/2013 0431   CREATININE 0.8 03/18/2013 1134   CREATININE 0.8 03/09/2011 1239   CALCIUM 8.7 05/23/2013 0431   CALCIUM 8.8  03/18/2013 1134   CALCIUM 9.3 03/09/2011 1239   PROT 6.4 05/20/2013 2105   PROT 6.1* 03/18/2013 1134   PROT 6.2* 03/09/2011 1239   ALBUMIN 3.4* 05/20/2013 2105   ALBUMIN 3.3* 03/18/2013 1134   AST 28 05/20/2013 2105   AST 15 03/18/2013 1134   AST 29 03/09/2011 1239   ALT 26 05/20/2013 2105   ALT 16 03/18/2013 1134   ALT 27 03/09/2011 1239   ALKPHOS 86 05/20/2013 2105   ALKPHOS 88 03/18/2013 1134   ALKPHOS 87* 03/09/2011 1239   BILITOT 0.3 05/20/2013 2105   BILITOT 0.54 03/18/2013 1134   BILITOT 0.60 03/09/2011 1239   GFRNONAA 88* 05/23/2013 0431   GFRAA >90 05/23/2013 0431    Assessment and Plan  Hip fracture Pt return to SNF after fracture and repair;pt is getting OT,PT and pain medications to continue as needed; pt is on coumadin 2 ng daily and INR needs 2-3 and to continue 4 weeks from d/c on 05/23/2013; CBC and INR drawn 8/11-results pending  GERD (gastroesophageal reflux disease) Pt is on protonix  HYPERTENSION Continue metoprolol  ADENOCARCINOMA, BREAST, BILATERAL, HX OF Continue anastrozole  Peripheral neuropathy Continue neurontin  ANXIETY continus buspar  Dementia Continue aricept  ADENOCARCINOMA, LUNG Pt is on O2 as needed  COPD (chronic obstructive pulmonary disease) Stable- on O2 as needed   Margit Hanks, MD

## 2013-06-03 ENCOUNTER — Encounter: Payer: Self-pay | Admitting: Internal Medicine

## 2013-06-03 ENCOUNTER — Non-Acute Institutional Stay (SKILLED_NURSING_FACILITY): Payer: Medicare Other | Admitting: Internal Medicine

## 2013-06-03 DIAGNOSIS — M25559 Pain in unspecified hip: Secondary | ICD-10-CM

## 2013-06-03 DIAGNOSIS — M25551 Pain in right hip: Secondary | ICD-10-CM

## 2013-06-03 NOTE — Progress Notes (Signed)
Patient ID: [name], female   DOB: September 07, 1930, 79 y.o.   MRN: [id number] Location:  Location:  [facility] Provider:  Gwenith Spitz. Renato Gails, D.O., C.M.D.  Code Status:  Full code   Chief Complaint  Patient presents with  . Acute Visit    right hip pain interfering with care needs    HPI: 79 yo female with h/o dementia, prior breast, lung and uterine cancer, osteoporosis, bipolar disorder, iron deficiency anemia was seen for acute visit due to concerns about refusing care (bathing, dressing, grooming assistance) due to pain in her right hip.  She is participating well with therapy, but does have significant pain despite her current tramadol and hydrocodone orders.  These were not much different from her orders before her hip fracture.  Staff feel she needs something scheduled in the morning.    Review of Systems:  Review of Systems  Constitutional: Negative for fever, chills and malaise/fatigue.  HENT: Negative for congestion.   Eyes: Negative for blurred vision.  Respiratory: Negative for cough and shortness of breath.   Cardiovascular: Negative for chest pain and leg swelling.       Edema better  Gastrointestinal: Negative for abdominal pain, constipation, blood in stool and melena.  Genitourinary: Negative for dysuria.  Musculoskeletal: Positive for back pain and joint pain. Negative for falls.  Skin: Negative for rash.  Neurological: Negative for dizziness and weakness.  Endo/Heme/Allergies: Bruises/bleeds easily.  Psychiatric/Behavioral: Positive for depression and memory loss. The patient is nervous/anxious.     Medications: Patient's Medications  New Prescriptions   No medications on file  Previous Medications   ANASTROZOLE (ARIMIDEX) 1 MG TABLET    Take 1 mg by mouth daily.   BETA CAROTENE W/MINERALS (OCUVITE) TABLET    Take 1 tablet by mouth daily.   BUSPIRONE (BUSPAR) 10 MG TABLET    Take 1 tablet (10 mg total) by mouth 2 (two) times daily.   DEXTROMETHORPHAN-GUAIFENESIN (ROBITUSSIN-DM) 10-100 MG/5ML LIQUID    Take 5 mLs by mouth every 4 (four) hours as needed for cough.   DIVALPROEX (DEPAKOTE SPRINKLE) 125 MG CAPSULE    Take 250-375 mg by mouth 2 (two) times daily. Take 3 capsules every morning and 2 capsules every evening.   DOCUSATE SODIUM 100 MG CAPS    Take 100 mg by mouth 2 (two) times daily.   DONEPEZIL (ARICEPT) 10 MG TABLET    Take 10 mg by mouth at bedtime.     FERROUS SULFATE 325 (65 FE) MG TABLET    Take 325 mg by mouth 2 (two) times daily.   FUROSEMIDE (LASIX) 40 MG TABLET    Take 40 mg by mouth 2 (two) times daily.    GABAPENTIN (NEURONTIN) 600 MG TABLET    Take 600 mg by mouth daily.   HYDROCODONE-ACETAMINOPHEN (NORCO) 5-325 MG PER TABLET    Take 2 tablets by mouth every 6 (six) hours as needed for pain.   HYDROXYZINE (ATARAX/VISTARIL) 25 MG TABLET    Take 25 mg by mouth 3 (three) times daily as needed. Itching.   MENTHOL, TOPICAL ANALGESIC, (BIOFREEZE ROLL-ON) 4 % GEL    Apply 1 application topically every 6 (six) hours. Ever 6 hrs   METOPROLOL TARTRATE (LOPRESSOR) 25 MG TABLET    Take 25 mg by mouth daily.     OMEPRAZOLE (PRILOSEC) 20 MG CAPSULE    Take 20 mg by mouth daily.   ONDANSETRON (ZOFRAN) 4 MG TABLET    Take 1 tablet (4 mg  total) by mouth every 6 (six) hours as needed for nausea (for refractory nausea or vomiting.).   POLYETHYLENE GLYCOL (MIRALAX / GLYCOLAX) PACKET    Take 17 g by mouth daily as needed.   POTASSIUM CHLORIDE SA (K-DUR,KLOR-CON) 20 MEQ TABLET    Take 20 mEq by mouth daily.   QUETIAPINE (SEROQUEL) 25 MG TABLET    Take 25 mg by mouth at bedtime.   TRAMADOL (ULTRAM) 50 MG TABLET    Take 1 tablet (50 mg total) by mouth every 4 (four) hours as needed for pain.   WARFARIN (COUMADIN) 2 MG TABLET    Take 1 tablet (2 mg total) by mouth daily.  Modified Medications   No medications on file  Discontinued Medications   No medications on file    Physical Exam: Filed Vitals:   06/03/13 1852  BP:  100/67  Pulse: 89  Temp: 98.1 F (36.7 C)  Resp: 18  Physical Exam  Constitutional: She appears well-developed and well-nourished. No distress.  Seated in wheelchair just before PT  HENT:  Head: Normocephalic and atraumatic.  Cardiovascular: Normal rate, regular rhythm, normal heart sounds and intact distal pulses.   Pulmonary/Chest: Effort normal and breath sounds normal. No respiratory distress.  Musculoskeletal: She exhibits tenderness. She exhibits no edema.  Of right hip w/o significant erythema, warmth, drainage  Neurological: She is alert.  Oriented to person and place, not time  Skin: Skin is warm and dry.    Labs reviewed: Basic Metabolic Panel:  Recent Labs  16/10/96 0045 05/22/13 0755 05/23/13 0431  NA 137 142 140  K 3.5 3.5 3.6  CL 97 104 105  CO2 30 27 28   GLUCOSE 120* 168* 127*  BUN 13 11 10   CREATININE 0.73 0.57 0.51  CALCIUM 9.0 8.7 8.7    Liver Function Tests:  Recent Labs  09/10/12 0919 03/18/13 1134 05/20/13 2105  AST 19 15 28   ALT 21 16 26   ALKPHOS 117 88 86  BILITOT 0.46 0.54 0.3  PROT 6.5 6.1* 6.4  ALBUMIN 3.2* 3.3* 3.4*    CBC:  Recent Labs  09/10/12 0919 03/18/13 1147  05/20/13 2105 05/21/13 0045 05/22/13 0755 05/23/13 0431  WBC 4.7 5.4  < > 7.3 10.1 11.0* 10.2  NEUTROABS 2.6 3.0  --  4.9  --   --   --   HGB 13.9 13.8  < > 14.3 13.7 12.3 9.8*  HCT 41.4 40.6  < > 43.2 40.5 38.0 30.0*  MCV 93.0 94.5  < > 95.4 94.0 95.7 95.2  PLT 195 164  < > 180 166 138* 135*  < > = values in this interval not displayed.  Assessment/Plan 1. Hip pain, right --will add morning scheduled extra strength tylenol to make morning ADLs easier --cont PT--doing well with this as the day goes on --also continue hydrocodone and tramadol  Family/ staff Communication: discussed her care with her nurse and PT Goals of care: full code Labs/tests ordered:  No new today, f/u with ortho next week as scheduled

## 2013-06-22 ENCOUNTER — Encounter: Payer: Self-pay | Admitting: Internal Medicine

## 2013-06-22 ENCOUNTER — Non-Acute Institutional Stay (SKILLED_NURSING_FACILITY): Payer: Medicare Other | Admitting: Internal Medicine

## 2013-06-22 DIAGNOSIS — S72001D Fracture of unspecified part of neck of right femur, subsequent encounter for closed fracture with routine healing: Secondary | ICD-10-CM

## 2013-06-22 DIAGNOSIS — Z853 Personal history of malignant neoplasm of breast: Secondary | ICD-10-CM

## 2013-06-22 DIAGNOSIS — F411 Generalized anxiety disorder: Secondary | ICD-10-CM

## 2013-06-22 DIAGNOSIS — J449 Chronic obstructive pulmonary disease, unspecified: Secondary | ICD-10-CM

## 2013-06-22 DIAGNOSIS — C3490 Malignant neoplasm of unspecified part of unspecified bronchus or lung: Secondary | ICD-10-CM

## 2013-06-22 DIAGNOSIS — G609 Hereditary and idiopathic neuropathy, unspecified: Secondary | ICD-10-CM

## 2013-06-22 DIAGNOSIS — G629 Polyneuropathy, unspecified: Secondary | ICD-10-CM

## 2013-06-22 DIAGNOSIS — S72009D Fracture of unspecified part of neck of unspecified femur, subsequent encounter for closed fracture with routine healing: Secondary | ICD-10-CM

## 2013-06-22 DIAGNOSIS — I10 Essential (primary) hypertension: Secondary | ICD-10-CM

## 2013-06-22 DIAGNOSIS — F0390 Unspecified dementia without behavioral disturbance: Secondary | ICD-10-CM

## 2013-06-22 DIAGNOSIS — K219 Gastro-esophageal reflux disease without esophagitis: Secondary | ICD-10-CM

## 2013-06-22 NOTE — Progress Notes (Signed)
MRN: [id number] Name: [name]  Sex: female Age: 79 y.o. DOB: 24-Jun-1930  PSC #: Ronni Rumble Facility/Room: 107 Level Of Care: SNF Provider: Merrilee Seashore D Emergency Contacts: Extended Emergency Contact Information Primary Emergency Contact: McIntyre,Richard Address: [address] [place], [place] [address] [place] Mobile Phone: [phone] Relation: Brother  Code Status: FULL  Allergies: Abilify  Chief Complaint  Patient presents with  . Discharge Note    HPI: Patient is 79 y.o. female who has had breast, lung and uterine CA, also bipolar, recent hip fracture who is being d/c/transferred to SNF in [place] to be close to family.  Past Medical History  Diagnosis Date  . Cancer   . Dementia   . COPD (chronic obstructive pulmonary disease)   . Hypertension   . Peripheral neuropathy     Past Surgical History  Procedure Laterality Date  . Breast lumpectomy    . Lung cancer surgery    . Uterine cancer    . Femur im nail Right 05/21/2013    Procedure: INTRAMEDULLARY (IM) NAIL FEMORAL;  Surgeon: Eldred Manges, MD;  Location: [facility];  Service: Orthopedics;  Laterality: Right;      Medication List       This list is accurate as of: 06/22/13  7:10 PM.  Always use your most recent med list.               anastrozole 1 MG tablet  Commonly known as:  ARIMIDEX  Take 1 mg by mouth daily.     beta carotene w/minerals tablet  Take 1 tablet by mouth daily.     BIOFREEZE ROLL-ON 4 % Gel  Generic drug:  Menthol (Topical Analgesic)  Apply 1 application topically every 6 (six) hours. Ever 6 hrs     busPIRone 10 MG tablet  Commonly known as:  BUSPAR  Take 1 tablet (10 mg total) by mouth 2 (two) times daily.     dextromethorphan-guaiFENesin 10-100 MG/5ML liquid  Commonly known as:  ROBITUSSIN-DM  Take 5 mLs by mouth every 4 (four) hours as needed for cough.     divalproex 125 MG capsule  Commonly known as:  DEPAKOTE SPRINKLE  Take 250-375 mg by  mouth 2 (two) times daily. Take 3 capsules every morning and 2 capsules every evening.     donepezil 10 MG tablet  Commonly known as:  ARICEPT  Take 10 mg by mouth at bedtime.     DSS 100 MG Caps  Take 100 mg by mouth 2 (two) times daily.     ferrous sulfate 325 (65 FE) MG tablet  Take 325 mg by mouth 2 (two) times daily.     furosemide 40 MG tablet  Commonly known as:  LASIX  Take 40 mg by mouth 2 (two) times daily.     gabapentin 600 MG tablet  Commonly known as:  NEURONTIN  Take 600 mg by mouth daily.     HYDROcodone-acetaminophen 5-325 MG per tablet  Commonly known as:  NORCO  Take 2 tablets by mouth every 6 (six) hours as needed for pain.     hydrOXYzine 25 MG tablet  Commonly known as:  ATARAX/VISTARIL  Take 25 mg by mouth 3 (three) times daily as needed. Itching.     metoprolol tartrate 25 MG tablet  Commonly known as:  LOPRESSOR  Take 25 mg by mouth daily.     omeprazole 20 MG capsule  Commonly known as:  PRILOSEC  Take 20 mg by mouth daily.     ondansetron 4 MG tablet  Commonly known as:  ZOFRAN  Take 1 tablet (4 mg total) by mouth every 6 (six) hours as needed for nausea (for refractory nausea or vomiting.).     polyethylene glycol packet  Commonly known as:  MIRALAX / GLYCOLAX  Take 17 g by mouth daily as needed.     potassium chloride SA 20 MEQ tablet  Commonly known as:  K-DUR,KLOR-CON  Take 20 mEq by mouth daily.     QUEtiapine 25 MG tablet  Commonly known as:  SEROQUEL  Take 25 mg by mouth at bedtime.     traMADol 50 MG tablet  Commonly known as:  ULTRAM  Take 1 tablet (50 mg total) by mouth every 4 (four) hours as needed for pain.     vitamin C 500 MG tablet  Commonly known as:  ASCORBIC ACID  Take 500 mg by mouth daily.     Vitamin D 2000 UNITS tablet  Take 2,000 Units by mouth daily.        Meds ordered this encounter  Medications  . Cholecalciferol (VITAMIN D) 2000 UNITS tablet    Sig: Take 2,000 Units by mouth daily.  . vitamin  C (ASCORBIC ACID) 500 MG tablet    Sig: Take 500 mg by mouth daily.         Review of Systems- pt has no c/o! Please remember the bio freeze, it helps a lot  Filed Vitals:   06/22/13 1746  BP: 110/61  Pulse: 78  Temp: 98.6 F (37 C)  Resp: 18    Physical Exam  GENERAL APPEARANCE: Alert, conversant. Appropriately groomed. No acute distress, OT is there, pt is working on upper body strength HEENT- unremarkable  RESPIRATORY: Breathing is even, unlabored. Lung sounds are clear   CARDIOVASCULAR: Heart RRR no murmurs, rubs or gallops. No peripheral edema.  GASTROINTESTINAL: Abdomen is soft, non-tender, not distended w/ normal bowel sounds NEUROLOGIC: Oriented X2. Cranial nerves 2-12 grossly intact. Moves all extremities no tremor. PSYCHIATRIC: Mood and affect appropriate to situation, no behavioral issues  Patient Active Problem List   Diagnosis Date Noted  . COPD (chronic obstructive pulmonary disease)   . Hypertension   . Peripheral neuropathy   . Dementia 05/21/2013  . Hip fracture 05/20/2013  . GERD (gastroesophageal reflux disease) 01/07/2013  . ADENOCARCINOMA, LUNG 08/05/2008  . ANXIETY 08/05/2008  . HYPERTENSION 08/05/2008  . ADENOCARCINOMA, BREAST, BILATERAL, HX OF 08/05/2008      CBC    Component Value Date/Time   WBC 10.2 05/23/2013 0431   WBC 5.4 03/18/2013 1147   RBC 3.15* 05/23/2013 0431   RBC 4.30 03/18/2013 1147   HGB 9.8* 05/23/2013 0431   HGB 13.8 03/18/2013 1147   HCT 30.0* 05/23/2013 0431   HCT 40.6 03/18/2013 1147   PLT 135* 05/23/2013 0431   PLT 164 03/18/2013 1147   MCV 95.2 05/23/2013 0431   MCV 94.5 03/18/2013 1147   LYMPHSABS 1.6 05/20/2013 2105   LYMPHSABS 1.7 03/18/2013 1147   MONOABS 0.7 05/20/2013 2105   MONOABS 0.6 03/18/2013 1147   EOSABS 0.1 05/20/2013 2105   EOSABS 0.1 03/18/2013 1147   BASOSABS 0.0 05/20/2013 2105   BASOSABS 0.0 03/18/2013 1147    CMP     Component Value Date/Time   NA 140 05/23/2013 0431   NA 145 03/18/2013 1134   NA 142 03/09/2011 1239   K  3.6 05/23/2013 0431   K  3.5 03/18/2013 1134   K 3.7 03/09/2011 1239   CL 105 05/23/2013 0431   CL 104 03/18/2013 1134   CL 100 03/09/2011 1239   CO2 28 05/23/2013 0431   CO2 31* 03/18/2013 1134   CO2 27 03/09/2011 1239   GLUCOSE 127* 05/23/2013 0431   GLUCOSE 76 03/18/2013 1134   GLUCOSE 144* 03/09/2011 1239   BUN 10 05/23/2013 0431   BUN 16.9 03/18/2013 1134   BUN 11 03/09/2011 1239   CREATININE 0.51 05/23/2013 0431   CREATININE 0.8 03/18/2013 1134   CREATININE 0.8 03/09/2011 1239   CALCIUM 8.7 05/23/2013 0431   CALCIUM 8.8 03/18/2013 1134   CALCIUM 9.3 03/09/2011 1239   PROT 6.4 05/20/2013 2105   PROT 6.1* 03/18/2013 1134   PROT 6.2* 03/09/2011 1239   ALBUMIN 3.4* 05/20/2013 2105   ALBUMIN 3.3* 03/18/2013 1134   AST 28 05/20/2013 2105   AST 15 03/18/2013 1134   AST 29 03/09/2011 1239   ALT 26 05/20/2013 2105   ALT 16 03/18/2013 1134   ALT 27 03/09/2011 1239   ALKPHOS 86 05/20/2013 2105   ALKPHOS 88 03/18/2013 1134   ALKPHOS 87* 03/09/2011 1239   BILITOT 0.3 05/20/2013 2105   BILITOT 0.54 03/18/2013 1134   BILITOT 0.60 03/09/2011 1239   GFRNONAA 88* 05/23/2013 0431   GFRAA >90 05/23/2013 0431    Assessment and Plan  Pt is stable on all her medications. She is off warfarin for her hip fracture and will continue OT/PT as long as there is benefit. She will need follow-up in December with Oncology. She will continue on arimidex until then.  Margit Hanks, MD

## 2013-08-20 ENCOUNTER — Encounter: Payer: Self-pay | Admitting: Adult Health

## 2013-08-20 NOTE — Progress Notes (Signed)
This encounter was created in error - please disregard.

## 2013-08-20 NOTE — Progress Notes (Signed)
Patient ID: [name], female   DOB: 1929-11-15, 79 y.o.   MRN: [id number]  [facility]  Allergies  Allergen Reactions  . Abilify [Aripiprazole]     Chief Complaint  Patient presents with  . Medical Managment of Chronic Issues    HPI  She is being seen for the management of her chronic illnesses. There are no concerns being voiced by the nursing staff at this time. She is not voicing any complaints or concerns herself today. She states she is doing well. There is no change in her overall status recently.    Past Medical History  Diagnosis Date  . Cancer   . Dementia   . COPD (chronic obstructive pulmonary disease)   . Hypertension   . Peripheral neuropathy   . HYPERTENSION 08/05/2008    Qualifier: Diagnosis of  By: Creta Levin CMA (AAMA), Robin    . ADENOCARCINOMA, LUNG 08/05/2008    Qualifier: Diagnosis of  By: Creta Levin CMA (AAMA), Robin    . GERD (gastroesophageal reflux disease) 01/07/2013  . Hip fracture 05/20/2013  . ADENOCARCINOMA, BREAST, BILATERAL, HX OF 08/05/2008    Qualifier: Diagnosis of  By: Creta Levin CMA (AAMA), Robin    . ANXIETY 08/05/2008    Qualifier: Diagnosis of  By: Creta Levin CMA Duncan Dull), Zella Ball      Past Surgical History  Procedure Laterality Date  . Breast lumpectomy    . Lung cancer surgery    . Uterine cancer    . Femur im nail Right 05/21/2013    Procedure: INTRAMEDULLARY (IM) NAIL FEMORAL;  Surgeon: Eldred Manges, MD;  Location: [facility];  Service: Orthopedics;  Laterality: Right;    Filed Vitals:   02/24/13 1347  BP: 116/75  Pulse: 80  Height: 5\' 7"  (1.702 m)  Weight: 158 lb (71.668 kg)    MEDICATIONS  Anastrozole  1 mg daily buspar 10 mg twice daily aricept 10 mg daily biofreeze to feet and legs every 6 hours as needed Vit d 100 units daily depakote 500 mg twice daily Iron twice daily Hydroxyzine 25 mg every 8 hours as needed Lasix 40 mg daily Lopressor 25 mg daily neurontin 600 mg daily prilosec 20 mg daily Percocet 5.325 mg 1-2  tabs every 12 hours as needed seroquel 50 mg nightly Ultram 50 mg every 4 hours as needed     LABS REVIEWED::  09-29-12: depakote 23.3 10-06-12: wbc 4.4 ;hgb 12.4; hct 36.4 ;mcv 881. plt 189; glucose 74; bun 25; creat 0.88; k+3.8; na++145; hgb a1c 5.5  12-10-12: hgb a1c 5.6     Review of Systems  Constitutional: Negative for appetite change and fatigue.  Respiratory: Negative for cough, shortness of breath and wheezing.   Cardiovascular: Negative for chest pain, palpitations and leg swelling.  Gastrointestinal: Negative for abdominal pain and constipation.  Musculoskeletal: Negative for myalgias, back pain and arthralgias.  Skin: Negative.   Neurological: Negative for headaches.  Psychiatric/Behavioral: Negative.    Physical Exam  Constitutional: She is oriented to person, place, and time. She appears well-developed and well-nourished.  HENT:  Head: Normocephalic.  Neck: Neck supple.  Cardiovascular: Normal rate, regular rhythm and intact distal pulses.   Pulmonary/Chest: Effort normal and breath sounds normal.  Abdominal: Soft. Bowel sounds are normal.  Musculoskeletal: Normal range of motion.  Uses wheelcchair  Neurological: She is alert and oriented to person, place, and time.  Skin: Skin is warm and dry.  Psychiatric: She has a normal mood and affect.    ASSESSMENT/PLAN  1. Breast cancer: without  change in status; will continue her anastrozole 1 mg daily and will monitor her status  2. Anemia: will continue iron twice daily  3. Hypertension: will continue lopressor 25 mg daily   4. Peripheral neuropathy: is stable will continue neurontin 600 mg daily; has percocet 5/325 mg every 12 hours as needed and ultram 50 mg every 4 hours as needed  5. Edema: is stable will continue lasix 40 mg daily  6. Genella Rife: will continue prilosec 20 mg daily  7. Anxiety; is stable will continue buspar 10 mg twice daily is taking seroquel 50 mg nightly to help with any agitation; and  depakote 500 mg twice daily to help stabilize mood.

## 2013-09-08 NOTE — Progress Notes (Signed)
Patient ID: [name], female   DOB: [birth year], 79 y.o.   MRN: [id number]     [facility]   Allergies  Allergen Reactions  . Abilify [Aripiprazole]     Chief Complaint  Patient presents with  . Medical Managment of Chronic Issues    HPI  She is being seen for the management of her chronic illnesses. Overall she has not had a significant change in her overall status. There are no concerns being voiced by the nursing staff at this time. She states that she is doing well and is not voicing any concerns or complaints at this time.   Past Medical History  Diagnosis Date  . Cancer   . Dementia   . COPD (chronic obstructive pulmonary disease)   . Hypertension   . Peripheral neuropathy   . HYPERTENSION 08/05/2008    Qualifier: Diagnosis of  By: Creta Levin CMA (AAMA), Robin    . ADENOCARCINOMA, LUNG 08/05/2008    Qualifier: Diagnosis of  By: Creta Levin CMA (AAMA), Robin    . GERD (gastroesophageal reflux disease) 01/07/2013  . ADENOCARCINOMA, BREAST, BILATERAL, HX OF 08/05/2008    Qualifier: Diagnosis of  By: Creta Levin CMA (AAMA), Robin    . ANXIETY 08/05/2008    Qualifier: Diagnosis of  By: Creta Levin CMA Duncan Dull), Zella Ball      Past Surgical History  Procedure Laterality Date  . Breast lumpectomy    . Lung cancer surgery    . Uterine cancer      Filed Vitals:   04/28/13 1008  BP: 120/81  Pulse: 70  Height: 5\' 1"  (1.549 m)  Weight: 163 lb (73.936 kg)    MEDICATIONS:  anastrole 1 mg daily aricept 10 mg daily biofreeze to legs every 6 hours as needed buspar 10 mg twice daily Vit d 100 units daily depakote 625 mg twice daily Iron twice daily Hydroxyzine 25 mg every 8 hours as needed Lasix 40 mg twice daily  lopressor 25 mg twice daily neurontin 600 mg daily prilosec 20 mg daily Percocet 5/325 mg  1 tab daily 2 tabs nightly and twice daily as needed seroquel 25 mg nightly Ultram 50 mg every 4 hours as needed    LABS REVIEWED::  09-29-12: depakote 23.3 10-06-12:  wbc 4.4 ;hgb 12.4; hct 36.4 ;mcv 881. plt 189; glucose 74; bun 25; creat 0.88; k+3.8; na++145; hgb a1c 5.5  12-10-12: hgb a1c 5.6  04-06-13: liver normal albumin 3.1; depakote 39.5     Review of Systems  Constitutional: Negative for appetite change and fatigue.  Respiratory: Negative for cough, shortness of breath and wheezing.   Cardiovascular: Negative for chest pain, palpitations and leg swelling.  Gastrointestinal: Negative for abdominal pain and constipation.  Musculoskeletal: Negative for myalgias, back pain and arthralgias.  Skin: Negative.   Neurological: Negative for headaches.  Psychiatric/Behavioral: Negative.    Physical Exam  Constitutional: She is oriented to person, place, and time. She appears well-developed and well-nourished.  HENT:  Head: Normocephalic.  Neck: Neck supple.  Cardiovascular: Normal rate, regular rhythm and intact distal pulses.   Pulmonary/Chest: Effort normal and breath sounds normal.  Abdominal: Soft. Bowel sounds are normal.  Musculoskeletal: Normal range of motion.  Uses wheelcchair  Neurological: She is alert and oriented to person, place, and time.  Skin: Skin is warm and dry.  Psychiatric: She has a normal mood and affect.    ASSESSMENT/PLAN  1. Breast cancer: without change in status; will continue her anastrozole 1 mg daily and will monitor her status  2. Anemia: will continue iron twice daily  3. Hypertension: will continue lopressor 25 mg daily   4. Peripheral neuropathy: is stable will continue neurontin 600 mg daily; will continue percocet 5/325 mg one tab in the am; 2 tabs in the pm and one tab twice daily as needed and ultram 50 mg every 4 hours as needed  5. Edema: is stable will continue lasix 40 mg twice daily  6. Genella Rife: will continue prilosec 20 mg daily  7. Anxiety; is stable will continue buspar 10 mg twice daily is taking seroquel 50 mg nightly to help with any agitation; and depakote 625  mg twice daily to help  stabilize mood.   8. Dementia: without significant change in status; will continue aricept 10 mg daily and will monitor   Will check cbc; bmp hgb a1c next blood draw

## 2013-09-17 ENCOUNTER — Telehealth: Payer: Self-pay | Admitting: Medical Oncology

## 2013-09-17 NOTE — Telephone Encounter (Signed)
[name] reported that pt has moved to [place] and pt brother cancelled her CT ibuprofen left message for brother to call me back

## 2013-09-21 ENCOUNTER — Telehealth: Payer: Self-pay | Admitting: Medical Oncology

## 2013-09-21 ENCOUNTER — Telehealth: Payer: Self-pay | Admitting: Internal Medicine

## 2013-09-21 NOTE — Telephone Encounter (Signed)
Per 12/8 pof the pt has moved to [place].

## 2013-09-21 NOTE — Telephone Encounter (Signed)
Brother called and cancelled pt appointments stating " she has moved to [place] "

## 2013-09-23 ENCOUNTER — Ambulatory Visit (HOSPITAL_COMMUNITY): Payer: Medicare Other

## 2013-09-23 ENCOUNTER — Other Ambulatory Visit: Payer: Medicare Other | Admitting: Lab

## 2013-09-24 ENCOUNTER — Ambulatory Visit: Payer: Medicare Other | Admitting: Internal Medicine

## 2014-07-07 ENCOUNTER — Non-Acute Institutional Stay (INDEPENDENT_AMBULATORY_CARE_PROVIDER_SITE_OTHER): Payer: Medicare Other | Admitting: Family Medicine

## 2014-07-07 DIAGNOSIS — Z593 Problems related to living in residential institution: Secondary | ICD-10-CM

## 2014-07-07 NOTE — Progress Notes (Addendum)
Patient ID: [name], female   DOB: 12-24-1929, 80 y.o.   MRN: [id number] Opened note to add discharge date to [facility] information.

## 2020-11-15 DEATH — deceased
